# Patient Record
Sex: Female | Born: 1980 | Hispanic: No | Marital: Married | Smoking: Never smoker
Health system: Southern US, Community
[De-identification: ages and names within clinical notes are randomized; demographics above are authoritative.]

---

## 2014-06-04 NOTE — L&D Delivery Note (Signed)
Deliver Note   Date of Delivery:   05/19/2015 Primary OB:   [facility]  Gestational Age/EDD: 133w6d by 05/27/2015, by Other Basis  Antepartum complications:  OB History    Gravida Para Term Preterm AB TAB SAB Ectopic Multiple Living   1 1 1       0 0      Delivered By:   Vena AustriaStaebler, Carnie Bruemmer MD  Delivery Type:   Forceps Anesthesia:     Epidural  Intrapartum complications:  GBS:    Negative (12/05 1706) Laceration:    2nd degree Episiotomy:    none Placenta:    Spontaneous Estimated Blood Loss:  300mL Baby:     Liveborn female  APGAR (1 MIN): 8   APGAR (5 MINS): 9   Weight  6lbs 4oz  Deliver Details   With pushing fetus began displaying deceleration into the 90's with 1-2 minute recover, variable response to scalp stimulation.  After discussion with patient on pro's and con's of expediting delivery decision made to proceed with forceps.  Fetus was noted to be in ROA position approximately 15 degree off center, at +2 station pushing to +3.  Long Elliot forceps were applied, placement was check and lined up with midline suture.  With next contraction forceps were articulated.  At the end of the contraction forceps were disarticulated and re-articulated with next contraction.  The fetal occiput was guided under the pubic symphysis, as the head began to crown the forceps were removed.  The left hand was rapped across the fetal neck and was splinted and delivered, remainder of the body delivery with ease.  No nuchal or body cords noted with delivery.  Cord was cut by father once it stopped pulsing.  Placenta delivered shortly thereafter.  Inspection of the perineum revealed intact external sphincter capsule.  The bulbocavernosous muscles were re-approximated in the midline using a 2-0 Vicryl sutures and tied into the external anal sphincter muscle bringing the deep layers together and rebuilding the perineal body  The tear did not extend deep into the vaginal vault, however the skin did tear to almost  the level of anus.  Remaining skin defect was closed with 4-0 monocryl in a subcuticular fashion.  Rectal exam was performed noting good sphincter tone without palpable defects, no suture material in the rectum.   Mom to postpartum.  Baby to Couplet care / Skin to Skin.

## 2014-11-12 LAB — HM PAP SMEAR: HM Pap smear: NEGATIVE

## 2014-11-12 LAB — OB RESULTS CONSOLE GC/CHLAMYDIA
Chlamydia: NEGATIVE
Gonorrhea: NEGATIVE

## 2015-03-09 LAB — OB RESULTS CONSOLE HIV ANTIBODY (ROUTINE TESTING): HIV: NONREACTIVE

## 2015-03-09 LAB — OB RESULTS CONSOLE RPR: RPR: NONREACTIVE

## 2015-05-09 LAB — OB RESULTS CONSOLE GBS: GBS: NEGATIVE

## 2015-05-18 ENCOUNTER — Inpatient Hospital Stay
Admission: EM | Admit: 2015-05-18 | Discharge: 2015-05-20 | DRG: 775 | Disposition: A | Discharge status: Home / Self Care | Payer: 59 | Attending: Obstetrics and Gynecology | Admitting: Obstetrics and Gynecology

## 2015-05-18 DIAGNOSIS — Z3A38 38 weeks gestation of pregnancy: Secondary | ICD-10-CM

## 2015-05-18 DIAGNOSIS — O4292 Full-term premature rupture of membranes, unspecified as to length of time between rupture and onset of labor: Principal | ICD-10-CM | POA: Diagnosis present

## 2015-05-18 LAB — CBC
HCT: 37.2 % (ref 35.0–47.0)
HEMOGLOBIN: 12.2 g/dL (ref 12.0–16.0)
MCH: 28.3 pg (ref 26.0–34.0)
MCHC: 32.8 g/dL (ref 32.0–36.0)
MCV: 86.3 fL (ref 80.0–100.0)
PLATELETS: 241 10*3/uL (ref 150–440)
RBC: 4.31 MIL/uL (ref 3.80–5.20)
RDW: 13.3 % (ref 11.5–14.5)
WBC: 12.9 10*3/uL — AB (ref 3.6–11.0)

## 2015-05-18 LAB — TYPE AND SCREEN
ABO/RH(D): B POS
Antibody Screen: NEGATIVE

## 2015-05-18 MED ORDER — TERBUTALINE SULFATE 1 MG/ML IJ SOLN: 0.2500 mg | Freq: Once | INTRAMUSCULAR | Status: DC | PRN

## 2015-05-18 MED ORDER — OXYTOCIN 10 UNIT/ML IJ SOLN: 10.0000 [IU] | Freq: Once | INTRAMUSCULAR | Status: DC

## 2015-05-18 MED ORDER — LACTATED RINGERS IV SOLN
500.0000 mL | INTRAVENOUS | Status: DC | PRN
  Administered 2015-05-19 (×2): 1000 mL via INTRAVENOUS

## 2015-05-18 MED ORDER — OXYTOCIN 10 UNIT/ML IJ SOLN
INTRAMUSCULAR | Status: AC
  Filled 2015-05-18: qty 2

## 2015-05-18 MED ORDER — BUTORPHANOL TARTRATE 1 MG/ML IJ SOLN
1.0000 mg | INTRAMUSCULAR | Status: DC | PRN
  Administered 2015-05-18 – 2015-05-19 (×2): 2 mg via INTRAVENOUS
  Filled 2015-05-18 (×2): qty 2

## 2015-05-18 MED ORDER — ONDANSETRON HCL 4 MG/2ML IJ SOLN
4.0000 mg | Freq: Four times a day (QID) | INTRAMUSCULAR | Status: DC | PRN
  Administered 2015-05-19: 4 mg via INTRAVENOUS
  Filled 2015-05-18: qty 2

## 2015-05-18 MED ORDER — OXYTOCIN 40 UNITS IN LACTATED RINGERS INFUSION - SIMPLE MED
62.5000 mL/h | INTRAVENOUS | Status: DC
  Administered 2015-05-19: 62.5 mL/h via INTRAVENOUS
  Administered 2015-05-19: 999 mL/h via INTRAVENOUS
  Filled 2015-05-18: qty 1000

## 2015-05-18 MED ORDER — CITRIC ACID-SODIUM CITRATE 334-500 MG/5ML PO SOLN: 30.0000 mL | ORAL | Status: DC | PRN

## 2015-05-18 MED ORDER — LIDOCAINE HCL (PF) 1 % IJ SOLN
INTRAMUSCULAR | Status: AC
  Filled 2015-05-18: qty 30

## 2015-05-18 MED ORDER — AMMONIA AROMATIC IN INHA
RESPIRATORY_TRACT | Status: AC
  Filled 2015-05-18: qty 10

## 2015-05-18 MED ORDER — OXYTOCIN BOLUS FROM INFUSION: 500.0000 mL | INTRAVENOUS | Status: DC

## 2015-05-18 MED ORDER — LIDOCAINE HCL (PF) 1 % IJ SOLN
30.0000 mL | INTRAMUSCULAR | Status: AC | PRN
  Administered 2015-05-19: 3 mL via SUBCUTANEOUS

## 2015-05-18 MED ORDER — OXYTOCIN 40 UNITS IN LACTATED RINGERS INFUSION - SIMPLE MED
1.0000 m[IU]/min | INTRAVENOUS | Status: DC
  Administered 2015-05-18: 2 m[IU]/min via INTRAVENOUS

## 2015-05-18 MED ORDER — MISOPROSTOL 200 MCG PO TABS
ORAL_TABLET | ORAL | Status: AC
  Filled 2015-05-18: qty 4

## 2015-05-18 MED ORDER — LACTATED RINGERS IV SOLN
INTRAVENOUS | Status: DC
  Administered 2015-05-18: 21:00:00 via INTRAVENOUS
  Administered 2015-05-19: 125 mL/h via INTRAVENOUS

## 2015-05-18 NOTE — Progress Notes (Signed)
Subjective:  Doing well, increasing intensity of contractions  Objective:   Vitals: Blood pressure 117/70, pulse 84, temperature 97.6 F (36.4 C), temperature source Oral, resp. rate 16, height 5\' 3"  (1.6 m), weight 70.761 kg (156 lb). General: NAD Abdomen:gravid, non-tender Cervical Exam:  Dilation: 2.5 Effacement (%): 70 Cervical Position: Middle Station: -2 Presentation: Vertex Exam by:: Bonney AidStaebler, MD  FHT: 140, moderate, +accels, no decels Toco: q525min  Results for orders placed or performed during the hospital encounter of 05/18/15 (from the past 24 hour(s))  CBC     Status: Abnormal   Collection Time: 05/18/15  8:51 PM  Result Value Ref Range   WBC 12.9 (H) 3.6 - 11.0 K/uL   RBC 4.31 3.80 - 5.20 MIL/uL   Hemoglobin 12.2 12.0 - 16.0 g/dL   HCT 69.637.2 29.535.0 - 28.447.0 %   MCV 86.3 80.0 - 100.0 fL   MCH 28.3 26.0 - 34.0 pg   MCHC 32.8 32.0 - 36.0 g/dL   RDW 13.213.3 44.011.5 - 10.214.5 %   Platelets 241 150 - 440 K/uL  Type and screen     Status: None   Collection Time: 05/18/15  8:51 PM  Result Value Ref Range   ABO/RH(D) B POS    Antibody Screen NEG    Sample Expiration 05/21/2015     Assessment:   34 y.o. G1P0 9860w5d with PROM  Plan:   1) Labor - start pitocin  2) Fetus - cat I tracing

## 2015-05-18 NOTE — H&P (Signed)
Obstetric H&P   Chief Complaint: Leaking fluid  Prenatal Care Provider: [facility]  History of Present Illness: 34 y.o. G1P0 at 38 weeks 5 days based on 6 day embryo transfer date of 09/09/14 giving EDC of 05/27/2015.  The patient noticed some increased discharge yesterday, was seen in clinic with negative nitrazine, ferning, and pooling.  She had some spotting overnight and contractions with mild spotting.  Contractions abated and she was seen again in clinic this afternoon with minimal change in cervix from 1.5/70/-2 to 2.5/70/-2 with no bleeding noted on exam and physiologic appearing discharge.  Went shopping after exam and soaked through her underwear.  She had been provided with a nitrazine strip for home testing and this was grossly positive at which time she was instructed to present to L&D with gross ROM noted.  +FM, mild contractions, no VB at present.  PNC noteable for ART pregnancy, negative PGD and fetal echo.  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  She did have some decease in fundal height coinciding with a drop in station noted yesterday and was scheduled for a growth ultrasound on Friday.   Weight gain this pregnancy appropriate at 23lbs,  ABO, Rh: B pos ABSC: negative   Rubella: Immune HCV: negative RPR: Non-reactive HBsAg: negative HIV: negative Hgb electrophoresis: AAA Pap 11/12/14 negative and negative HPV CMV: IgG positive 1-hr: 134 GBS: negative 05/09/2015  TDAP and influenza up to date  Review of Systems: 10 point review of systems negative unless otherwise noted in HPI  Past Medical History: No past medical history on file.  Past Surgical History: History reviewed. No pertinent past surgical history.   Family History: History reviewed. No pertinent family history.  Social History: Social History   Social History  . Marital Status: Married    Spouse Name: N/A  . Number of Children: N/A  . Years of Education: N/A   Occupational History  . Not on  file.   Social History Main Topics  . Smoking status: Never Smoker   . Smokeless tobacco: Never Used  . Alcohol Use: No  . Drug Use: No  . Sexual Activity: Yes    Birth Control/ Protection: None   Other Topics Concern  . Not on file   Social History Narrative  . No narrative on file    Medications: Prior to Admission medications   Medication Sig Start Date End Date Taking? Authorizing Provider  ferrous fumarate (HEMOCYTE - 106 MG FE) 325 (106 FE) MG TABS tablet Take 1 tablet by mouth.   Yes Historical Provider, MD  Prenatal Vit-Fe Fumarate-FA (PRENATAL MULTIVITAMIN) TABS tablet Take 1 tablet by mouth daily at 12 noon.   Yes Historical Provider, MD    Allergies: No Known Allergies  Physical Exam: Vitals: Blood pressure 117/70, pulse 84, temperature 97.6 F (36.4 C), temperature source Oral, resp. rate 16, height 5\' 3"  (1.6 m), weight 70.761 kg (156 lb).  Urine Dip Protein: N/A  FHT: 130, moderate, +accels, no decels Toco: q4-475min  General: NAD HEENT: normocephalic, anicteric Pulmonary: no increased work of breathing Abdomen: Gravid, non-tender Leopolds: vtx Genitourinary: grossly ruptured 2.5/70/-2 in clinic 16:30 today Extremities: no edema  Labs: No results found for this or any previous visit (from the past 24 hour(s)).  Assessment: 34 y.o. G1P0 at 3169w5d presenting with PROM  Plan: 1) PROM - is contracting fairly regular, will monitor contraction pattern recheck in 2-hrs to see if change from clinic check.  If no change start pitocin  2) Fetus -  category I tracing, negative spontaneous contraction stress test on presentation  3) A pos / ABSC neg / RI / HBsAg neg / HIV neg / RPR NR / CMV IgG positive (work in pediatrics) / HCV negative / Hgb AA / 1-hr 134 / GBS negative 05/09/15 / and normal pap   4) TDAP (04/19/2015) and influenza (03/01/2015) up to date   5) Disposition - pending delivery anticipate vaginal

## 2015-05-19 ENCOUNTER — Inpatient Hospital Stay: Payer: 59 | Admitting: Anesthesiology

## 2015-05-19 ENCOUNTER — Encounter: Payer: Self-pay | Admitting: *Deleted

## 2015-05-19 LAB — ABO/RH: ABO/RH(D): B POS

## 2015-05-19 MED ORDER — SENNOSIDES-DOCUSATE SODIUM 8.6-50 MG PO TABS: 2.0000 | ORAL_TABLET | ORAL | Status: DC

## 2015-05-19 MED ORDER — FENTANYL 2.5 MCG/ML W/ROPIVACAINE 0.2% IN NS 100 ML EPIDURAL INFUSION (ARMC-ANES): 9.0000 mL/h | EPIDURAL | Status: DC

## 2015-05-19 MED ORDER — PRENATAL MULTIVITAMIN CH
1.0000 | ORAL_TABLET | Freq: Every day | ORAL | Status: DC
  Administered 2015-05-20: 1 via ORAL
  Filled 2015-05-19: qty 1

## 2015-05-19 MED ORDER — SIMETHICONE 80 MG PO CHEW: 80.0000 mg | CHEWABLE_TABLET | ORAL | Status: DC | PRN

## 2015-05-19 MED ORDER — IBUPROFEN 600 MG PO TABS: 600.0000 mg | ORAL_TABLET | Freq: Four times a day (QID) | ORAL | Status: DC

## 2015-05-19 MED ORDER — DIPHENHYDRAMINE HCL 25 MG PO CAPS: 25.0000 mg | ORAL_CAPSULE | Freq: Four times a day (QID) | ORAL | Status: DC | PRN

## 2015-05-19 MED ORDER — LANOLIN HYDROUS EX OINT: TOPICAL_OINTMENT | CUTANEOUS | Status: DC | PRN

## 2015-05-19 MED ORDER — BENZOCAINE-MENTHOL 20-0.5 % EX AERO
INHALATION_SPRAY | CUTANEOUS | Status: AC
  Filled 2015-05-19: qty 56

## 2015-05-19 MED ORDER — DIPHENHYDRAMINE HCL 50 MG/ML IJ SOLN: 12.5000 mg | INTRAMUSCULAR | Status: DC | PRN

## 2015-05-19 MED ORDER — FENTANYL 2.5 MCG/ML W/ROPIVACAINE 0.2% IN NS 100 ML EPIDURAL INFUSION (ARMC-ANES)
EPIDURAL | Status: AC
  Administered 2015-05-19: 9 mL/h via EPIDURAL
  Filled 2015-05-19: qty 100

## 2015-05-19 MED ORDER — IBUPROFEN 600 MG PO TABS
600.0000 mg | ORAL_TABLET | Freq: Four times a day (QID) | ORAL | Status: DC
  Administered 2015-05-19 – 2015-05-20 (×3): 600 mg via ORAL
  Filled 2015-05-19 (×4): qty 1

## 2015-05-19 MED ORDER — LIDOCAINE-EPINEPHRINE (PF) 1.5 %-1:200000 IJ SOLN
INTRAMUSCULAR | Status: DC | PRN
  Administered 2015-05-19: 4 mL via EPIDURAL

## 2015-05-19 MED ORDER — PHENYLEPHRINE 40 MCG/ML (10ML) SYRINGE FOR IV PUSH (FOR BLOOD PRESSURE SUPPORT)
80.0000 ug | PREFILLED_SYRINGE | INTRAVENOUS | Status: DC | PRN
  Filled 2015-05-19: qty 2

## 2015-05-19 MED ORDER — EPHEDRINE 5 MG/ML INJ
10.0000 mg | INTRAVENOUS | Status: DC | PRN
  Filled 2015-05-19: qty 2

## 2015-05-19 MED ORDER — BUPIVACAINE HCL (PF) 0.25 % IJ SOLN
INTRAMUSCULAR | Status: DC | PRN
  Administered 2015-05-19: 5 mL via EPIDURAL

## 2015-05-19 MED ORDER — DIBUCAINE 1 % RE OINT: 1.0000 "application " | TOPICAL_OINTMENT | RECTAL | Status: DC | PRN

## 2015-05-19 MED ORDER — ONDANSETRON HCL 4 MG/2ML IJ SOLN: 4.0000 mg | INTRAMUSCULAR | Status: DC | PRN

## 2015-05-19 MED ORDER — IBUPROFEN 600 MG PO TABS
ORAL_TABLET | ORAL | Status: AC
  Administered 2015-05-19: 600 mg
  Filled 2015-05-19: qty 1

## 2015-05-19 MED ORDER — ONDANSETRON HCL 4 MG PO TABS: 4.0000 mg | ORAL_TABLET | ORAL | Status: DC | PRN

## 2015-05-19 MED ORDER — BENZOCAINE-MENTHOL 20-0.5 % EX AERO: 1.0000 "application " | INHALATION_SPRAY | CUTANEOUS | Status: DC | PRN

## 2015-05-19 MED ORDER — HYDROCODONE-ACETAMINOPHEN 5-325 MG PO TABS: 1.0000 | ORAL_TABLET | ORAL | Status: DC | PRN

## 2015-05-19 MED ORDER — WITCH HAZEL-GLYCERIN EX PADS: 1.0000 "application " | MEDICATED_PAD | CUTANEOUS | Status: DC | PRN

## 2015-05-19 NOTE — Lactation Note (Signed)
This note was copied from the chart of Boy Leanne ChangZainab Kempfer. Lactation Consultation Note  Patient Name: Boy Leanne ChangZainab Mickiewicz [id number]'s Date: 05/19/2015     Maternal Data    Feeding Feeding Type: Breast Fed                             Trudee GripCarolyn P Harman Langhans 05/19/2015, 8:11 PM

## 2015-05-19 NOTE — Lactation Note (Signed)
This note was copied from the chart of Boy Leanne ChangZainab Himes. Lactation Consultation Note  Patient Name: Boy Leanne ChangZainab Whiters [id number]'s Date: 05/19/2015     Maternal Data    Feeding Feeding Type: Breast Fed  LATCH Score/Interventions                      Lactation Tools Discussed/Used     Consult Status      Trudee GripCarolyn P Jsiah Menta 05/19/2015, 8:22 PM

## 2015-05-19 NOTE — Discharge Summary (Addendum)
Obstetric Discharge Summary Reason for Admission: rupture of membranes Prenatal Procedures: NST Intrapartum Procedures: spontaneous vaginal delivery and forceps (low) Postpartum Procedures: none Complications-Operative and Postpartum: none HEMOGLOBIN  Date Value Ref Range Status  05/18/2015 12.2 12.0 - 16.0 g/dL Final   HCT  Date Value Ref Range Status  05/18/2015 37.2 35.0 - 47.0 % Final    Physical Exam:  General: alert, appears stated age and no distress Lochia: appropriate Uterine Fundus: firm DVT Evaluation: No evidence of DVT seen on physical exam.  Discharge Diagnoses: Term Pregnancy-delivered  Discharge Information: Date: 05/20/2015 Activity: pelvic rest Diet: routine   Medication List    TAKE these medications        ferrous fumarate 325 (106 FE) MG Tabs tablet  Commonly known as:  HEMOCYTE - 106 mg FE  Take 1 tablet by mouth.     HYDROcodone-acetaminophen 5-325 MG tablet  Commonly known as:  NORCO/VICODIN  Take 1-2 tablets by mouth every 4 (four) hours as needed for moderate pain or severe pain.     ibuprofen 600 MG tablet  Commonly known as:  ADVIL,MOTRIN  Take 1 tablet (600 mg total) by mouth every 6 (six) hours.     prenatal multivitamin Tabs tablet  Take 1 tablet by mouth daily at 12 noon.        Condition: stable Discharge to: home Follow-up Information    Follow up with Lorrene ReidSTAEBLER, Jariah Jarmon M, MD In 6 weeks.   Specialty:  Obstetrics and Gynecology   Why:  postpartum visit   Contact information:   [address] [place] [address] [phone]       Newborn Data: Live born female  Birth Weight: 6 lb 3 oz (2807 g) APGAR: 8, 9  Home with mother.  Brook Geraci M 05/20/2015, 8:11 AM

## 2015-05-19 NOTE — Anesthesia Procedure Notes (Signed)
Epidural Patient location during procedure: OB  Staffing Performed by: anesthesiologist   Preanesthetic Checklist Completed: patient identified, site marked, surgical consent, pre-op evaluation, timeout performed, IV checked, risks and benefits discussed and monitors and equipment checked  Epidural Patient position: sitting Prep: Betadine Patient monitoring: heart rate, continuous pulse ox and blood pressure Approach: midline Location: L4-L5 Injection technique: LOR air  Needle:  Needle type: Tuohy  Needle gauge: 18 G Needle length: 9 cm and 9 Needle insertion depth: 5 cm Catheter type: closed end flexible Catheter size: 20 Guage Catheter at skin depth: 10 cm Test dose: negative and 1.5% lidocaine with Epi 1:200 K  Assessment Sensory level: T10 Events: blood not aspirated, injection not painful, no injection resistance, negative IV test and no paresthesia  Additional Notes Pt's history reviewed and consent obtained as per OB consent Patient tolerated the insertion well without complications. Negative SATD, negative IVTD All VSS were obtained and monitored through OBIX and nursing protocols followed.Reason for block:procedure for pain

## 2015-05-19 NOTE — Lactation Note (Signed)
This note was copied from the chart of Carla Leanne ChangZainab Lanese. Lactation Consultation Note  Patient Name: Carla Holt [id number]'s Date: 05/19/2015     Maternal Data    Feeding Feeding Type: Breast Fed  LATCH Score/Interventions No interventions at this time                      Lactation Tools Discussed/Used     Consult Status      Trudee GripCarolyn P Jeanpaul Biehl 05/19/2015, 8:10 PM

## 2015-05-19 NOTE — Anesthesia Preprocedure Evaluation (Signed)
Anesthesia Evaluation  Patient identified by MRN, date of birth, ID band Patient awake    Reviewed: Allergy & Precautions, H&P , NPO status , Patient's Chart, lab work & pertinent test results, reviewed documented beta blocker date and time   Airway Mallampati: III  TM Distance: >3 FB Neck ROM: full    Dental no notable dental hx. (+) Teeth Intact   Pulmonary neg pulmonary ROS,    Pulmonary exam normal breath sounds clear to auscultation       Cardiovascular Exercise Tolerance: Good negative cardio ROS Normal cardiovascular exam Rhythm:regular Rate:Normal     Neuro/Psych negative neurological ROS  negative psych ROS   GI/Hepatic negative GI ROS, Neg liver ROS,   Endo/Other  negative endocrine ROS  Renal/GU negative Renal ROS  negative genitourinary   Musculoskeletal   Abdominal   Peds  Hematology negative hematology ROS (+)   Anesthesia Other Findings   Reproductive/Obstetrics (+) Pregnancy                             Anesthesia Physical Anesthesia Plan  ASA: II  Anesthesia Plan: Regional and Epidural   Post-op Pain Management:    Induction:   Airway Management Planned:   Additional Equipment:   Intra-op Plan:   Post-operative Plan:   Informed Consent: I have reviewed the patients History and Physical, chart, labs and discussed the procedure including the risks, benefits and alternatives for the proposed anesthesia with the patient or authorized representative who has indicated his/her understanding and acceptance.     Plan Discussed with: CRNA  Anesthesia Plan Comments:         Anesthesia Quick Evaluation  

## 2015-05-20 LAB — CBC
HEMATOCRIT: 24.1 % — AB (ref 35.0–47.0)
Hemoglobin: 7.9 g/dL — ABNORMAL LOW (ref 12.0–16.0)
MCH: 28.6 pg (ref 26.0–34.0)
MCHC: 32.9 g/dL (ref 32.0–36.0)
MCV: 86.9 fL (ref 80.0–100.0)
PLATELETS: 185 10*3/uL (ref 150–440)
RBC: 2.77 MIL/uL — ABNORMAL LOW (ref 3.80–5.20)
RDW: 13.2 % (ref 11.5–14.5)
WBC: 12.1 10*3/uL — ABNORMAL HIGH (ref 3.6–11.0)

## 2015-05-20 LAB — RPR: RPR: NONREACTIVE

## 2015-05-20 MED ORDER — IBUPROFEN 600 MG PO TABS: 600.0000 mg | ORAL_TABLET | Freq: Four times a day (QID) | ORAL | Status: DC

## 2015-05-20 MED ORDER — HYDROCODONE-ACETAMINOPHEN 5-325 MG PO TABS: 1.0000 | ORAL_TABLET | ORAL | Status: DC | PRN

## 2015-05-20 NOTE — Progress Notes (Signed)
All discharge instructions given to patient and she voiced understanding of all instructions given. She will make her own f/u appt. Prescription given. Waiting on birth certificate and alarm removal.

## 2015-05-20 NOTE — Discharge Instructions (Signed)
Follow up sooner with fever, problems breathing, pain not helped by medications, severe depression( more than just baby blues, wanting to hurt yourself or the baby), severe bleeding ( saturating more than one pad an hour or large palm sized clots), no heavy lifting , no driving while taking narcotics, no douches, intercourse, tampons or enemas for 6 weeks  °

## 2015-05-20 NOTE — Anesthesia Postprocedure Evaluation (Signed)
Anesthesia Post Note  Patient: Carla Holt  Procedure(s) Performed: * No procedures listed *  Patient location during evaluation: Mother Baby Anesthesia Type: Epidural Level of consciousness: awake, awake and alert and oriented Pain management: pain level controlled Vital Signs Assessment: post-procedure vital signs reviewed and stable Respiratory status: spontaneous breathing and respiratory function stable Cardiovascular status: blood pressure returned to baseline and stable Postop Assessment: no headache, no signs of nausea or vomiting, adequate PO intake and patient able to bend at knees    Last Vitals:  Filed Vitals:   05/19/15 2311 05/20/15 0304  BP: 100/58 100/59  Pulse: 101 99  Temp: 36.4 C 36.9 C  Resp: 20     Last Pain:  Filed Vitals:   05/20/15 0305  PainSc: 4                  Michaele OfferSavage,  Leva Baine A

## 2015-05-20 NOTE — Addendum Note (Signed)
Addendum  created 05/20/15 [id number] by Yves DillPaul Verba Ainley, MD   Modules edited: Anesthesia Attestations, Anesthesia Responsible Staff

## 2015-10-10 DIAGNOSIS — N1330 Unspecified hydronephrosis: Secondary | ICD-10-CM | POA: Diagnosis not present

## 2015-10-10 DIAGNOSIS — N23 Unspecified renal colic: Secondary | ICD-10-CM | POA: Diagnosis not present

## 2015-10-10 DIAGNOSIS — N200 Calculus of kidney: Secondary | ICD-10-CM | POA: Diagnosis not present

## 2015-10-10 DIAGNOSIS — N132 Hydronephrosis with renal and ureteral calculous obstruction: Secondary | ICD-10-CM | POA: Diagnosis not present

## 2015-10-10 DIAGNOSIS — R109 Unspecified abdominal pain: Secondary | ICD-10-CM | POA: Diagnosis not present

## 2015-12-15 DIAGNOSIS — N979 Female infertility, unspecified: Secondary | ICD-10-CM | POA: Diagnosis not present

## 2015-12-17 DIAGNOSIS — N200 Calculus of kidney: Secondary | ICD-10-CM | POA: Diagnosis not present

## 2015-12-17 DIAGNOSIS — R1012 Left upper quadrant pain: Secondary | ICD-10-CM | POA: Diagnosis not present

## 2015-12-18 DIAGNOSIS — R1012 Left upper quadrant pain: Secondary | ICD-10-CM | POA: Diagnosis not present

## 2015-12-18 DIAGNOSIS — N200 Calculus of kidney: Secondary | ICD-10-CM | POA: Diagnosis not present

## 2015-12-26 ENCOUNTER — Encounter: Payer: Self-pay | Admitting: Urology

## 2015-12-26 ENCOUNTER — Ambulatory Visit (INDEPENDENT_AMBULATORY_CARE_PROVIDER_SITE_OTHER): Payer: 59 | Admitting: Urology

## 2015-12-26 DIAGNOSIS — N200 Calculus of kidney: Secondary | ICD-10-CM

## 2015-12-26 NOTE — Progress Notes (Signed)
12/26/2015 10:03 AM   Carla Holt [birth year] [id number]  Referring provider: No referring provider defined for this encounter.  No chief complaint on file.   HPI:  1 - Recurrent Nephrolithiasis -  10/2015 - medical passage of right ureteral stone (confirmed on CT in [place]) 12/2015 - 4mm left distal ureteral stone with mild hydro by CT done at [facility] in [facility] 12/2015. [name] is solitary per radiology report but actual images not able to review. UA,UCX without infetious parameters. Given trial of medical therapy and reports interval resolution of pain, stopped straining urine.  2 - Metabolic Stone Disease -  Eval 2017: BMP,PTH,Urate - pending; Composition - CaOx crystals on UA (no true stone analysis); 24 Hr Urines - pending  PMH sig for infertility (success after IVF with [facility] in [place] area). No surgeries. No regular medications. 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   Today " Carla Holt " (pronounced zane-ab) is seen as new patient for above.    PMH: No past medical history on file.  Surgical History: No past surgical history on file.  Home Medications:    Medication List       Accurate as of 12/26/15 10:03 AM. Always use your most recent med list.          ferrous fumarate 325 (106 Fe) MG Tabs tablet Commonly known as:  HEMOCYTE - 106 mg FE Take 1 tablet by mouth.   HYDROcodone-acetaminophen 5-325 MG tablet Commonly known as:  NORCO/VICODIN Take 1-2 tablets by mouth every 4 (four) hours as needed for moderate pain or severe pain.   ibuprofen 600 MG tablet Commonly known as:  ADVIL,MOTRIN Take 1 tablet (600 mg total) by mouth every 6 (six) hours.   prenatal multivitamin Tabs tablet Take 1 tablet by mouth daily at 12 noon.       Allergies: No Known Allergies  Family History: No family history on file.  Social History:  reports that she has never smoked. She has never used smokeless tobacco. She reports that she does not drink  alcohol or use drugs.   Review of Systems  Gastrointestinal (upper)  : Negative for upper GI symptoms  Gastrointestinal (lower) : Negative for lower GI symptoms  Constitutional : Negative for symptoms  Skin: Negative for skin symptoms  Eyes: Negative for eye symptoms  Ear/Nose/Throat : Negative for Ear/Nose/Throat symptoms  Hematologic/Lymphatic: Negative for Hematologic/Lymphatic symptoms  Cardiovascular : Negative for cardiovascular symptoms  Respiratory : Negative for respiratory symptoms  Endocrine: Negative for endocrine symptoms  Musculoskeletal: Negative for musculoskeletal symptoms  Neurological: Negative for neurological symptoms  Psychologic: Negative for psychiatric symptoms    Physical Exam: There were no vitals taken for this visit.  Constitutional:  Alert and oriented, No acute distress. HEENT: [place] AT, moist mucus membranes.  Trachea midline, no masses. Cardiovascular: No clubbing, cyanosis, or edema. Respiratory: Normal respiratory effort, no increased work of breathing. GI: Abdomen is soft, nontender, nondistended, no abdominal masses GU: No CVA tenderness.  Skin: No rashes, bruises or suspicious lesions. Lymph: No cervical adenopathy. Neurologic: Grossly intact, no focal deficits, moving all 4 extremities. Psychiatric: Normal mood and affect.   Urinalysis No results found for: COLORURINE, APPEARANCEUR, LABSPEC, PHURINE, GLUCOSEU, HGBUR, BILIRUBINUR, KETONESUR, PROTEINUR, UROBILINOGEN, NITRITE, LEUKOCYTESUR  Pertinent Imaging: As per HPI  Assessment & Plan:    1. Recurrent Nephrolithiasis - now likely stone free as symptoms resolved and most recent stone 12mm distal. Did frankly discuss that distal stone may be intermittently obstructing and in  situ as she does still have some microhematuria today.   Also frankly discussed implicaiotns of nephrolithiasis in fertile age female with STRONG reccomendation of renal imaging prior to future  elective pregnancies, and yearly surveillance with KUB / RUS and likely CT stone prior to additional elective IVF cycles to maximally verify stone free prior.   2 - Metabolic Stone Disease - I feel eval warranted as multifocal / recurrent at young age and desiring additional pregnancies. BMP,PTH with iCa, Urate today. 24 hr urines on return. She will bring any stone fragments that she passes in future.   RTC about 2 mos with 24 hr urines prior.   Sebastian Ache, MD  [facility] [address] [place], [place] [address] [phone]

## 2015-12-27 LAB — URINALYSIS, COMPLETE
Bilirubin, UA: NEGATIVE
Glucose, UA: NEGATIVE
Ketones, UA: NEGATIVE
Leukocytes, UA: NEGATIVE
Nitrite, UA: NEGATIVE
PH UA: 6 (ref 5.0–7.5)
PROTEIN UA: NEGATIVE
Specific Gravity, UA: 1.025 (ref 1.005–1.030)
UUROB: 0.2 mg/dL (ref 0.2–1.0)

## 2015-12-27 LAB — PTH, INTACT AND CALCIUM: PTH: 37 pg/mL (ref 15–65)

## 2015-12-27 LAB — BASIC METABOLIC PANEL
BUN / CREAT RATIO: 24 — AB (ref 9–23)
BUN: 17 mg/dL (ref 6–20)
CALCIUM: 9.6 mg/dL (ref 8.7–10.2)
CHLORIDE: 100 mmol/L (ref 96–106)
CO2: 19 mmol/L (ref 18–29)
CREATININE: 0.7 mg/dL (ref 0.57–1.00)
GFR calc Af Amer: 133 mL/min/{1.73_m2} (ref >59)
GFR calc non Af Amer: 115 mL/min/{1.73_m2} (ref >59)
GLUCOSE: 121 mg/dL — AB (ref 65–99)
Potassium: 3.7 mmol/L (ref 3.5–5.2)
Sodium: 139 mmol/L (ref 134–144)

## 2015-12-27 LAB — MICROSCOPIC EXAMINATION: WBC UA: NONE SEEN /HPF (ref 0–?)

## 2015-12-27 LAB — URIC ACID: URIC ACID: 3.6 mg/dL (ref 2.5–7.1)

## 2016-01-13 ENCOUNTER — Telehealth: Payer: Self-pay | Admitting: Urology

## 2016-01-13 NOTE — Telephone Encounter (Signed)
Pt called and said she still hasn't received her 24 hour urine.  Please give her a call.

## 2016-01-16 ENCOUNTER — Encounter: Payer: Self-pay | Admitting: Urology

## 2016-01-16 NOTE — Telephone Encounter (Signed)
Spoke with pt in reference to [facility]. New 24 hour orders faxed.

## 2016-01-16 NOTE — Telephone Encounter (Signed)
LMOM

## 2016-01-24 DIAGNOSIS — N200 Calculus of kidney: Secondary | ICD-10-CM | POA: Diagnosis not present

## 2016-01-25 DIAGNOSIS — N200 Calculus of kidney: Secondary | ICD-10-CM | POA: Diagnosis not present

## 2016-02-14 ENCOUNTER — Other Ambulatory Visit: Payer: Self-pay | Admitting: Urology

## 2016-02-20 ENCOUNTER — Ambulatory Visit (INDEPENDENT_AMBULATORY_CARE_PROVIDER_SITE_OTHER): Payer: 59 | Admitting: Urology

## 2016-02-20 ENCOUNTER — Encounter: Payer: Self-pay | Admitting: Urology

## 2016-02-20 VITALS — BP 113/77 | HR 92 | Ht 63.0 in | Wt 136.0 lb

## 2016-02-20 DIAGNOSIS — N200 Calculus of kidney: Secondary | ICD-10-CM | POA: Diagnosis not present

## 2016-02-20 DIAGNOSIS — R34 Anuria and oliguria: Secondary | ICD-10-CM | POA: Insufficient documentation

## 2016-02-20 NOTE — Progress Notes (Signed)
12/26/2015 10:03 AM   Carla Holt 1980-06-11 [id number]  Referring provider: No referring provider defined for this encounter.  No chief complaint on file.   HPI:  1 - Recurrent Nephrolithiasis -  10/2015 - medical passage of right ureteral stone (confirmed on CT in [place]) 12/2015 - 2mm left distal ureteral stone with mild hydro by CT done at [facility] in [facility] 12/2015. Carla Holt is solitary per radiology report but actual images not able to review. UA,UCX without infetious parameters. Given trial of medical therapy and reports interval resolution of pain, stopped straining urine.  2 - Metabolic Stone Disease /  Oliguria -  Eval 2017: BMP,PTH,Urate - normal; Composition - CaOx crystals on UA (no true stone analysis); 24 Hr Urines - extremely low volume ( or less).  PMH sig for infertility (success after IVF with [facility] in [place] area). No surgeries. No regular medications. 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   Today " Carla Holt " is seen in f/u above. Metabolic eval with impressive oliguria, otherwise unremarkable. She is looking to have IVF again in December.    PMH: No past medical history on file.  Surgical History: No past surgical history on file.  Home Medications:    Medication List       Accurate as of 12/26/15 10:03 AM. Always use your most recent med list.          ferrous fumarate 325 (106 Fe) MG Tabs tablet Commonly known as:  HEMOCYTE - 106 mg FE Take 1 tablet by mouth.   HYDROcodone-acetaminophen 5-325 MG tablet Commonly known as:  NORCO/VICODIN Take 1-2 tablets by mouth every 4 (four) hours as needed for moderate pain or severe pain.   ibuprofen 600 MG tablet Commonly known as:  ADVIL,MOTRIN Take 1 tablet (600 mg total) by mouth every 6 (six) hours.   prenatal multivitamin Tabs tablet Take 1 tablet by mouth daily at 12 noon.       Allergies: No Known Allergies  Family History: No family history on file.  Social  History:  reports that she has never smoked. She has never used smokeless tobacco. She reports that she does not drink alcohol or use drugs.   Review of Systems  Gastrointestinal (upper)  : Negative for upper GI symptoms  Gastrointestinal (lower) : Negative for lower GI symptoms  Constitutional : Negative for symptoms  Skin: Negative for skin symptoms  Eyes: Negative for eye symptoms  Ear/Nose/Throat : Negative for Ear/Nose/Throat symptoms  Hematologic/Lymphatic: Negative for Hematologic/Lymphatic symptoms  Cardiovascular : Negative for cardiovascular symptoms  Respiratory : Negative for respiratory symptoms  Endocrine: Negative for endocrine symptoms  Musculoskeletal: Negative for musculoskeletal symptoms  Neurological: Negative for neurological symptoms  Psychologic: Negative for psychiatric symptoms    Physical Exam: There were no vitals taken for this visit.  Constitutional:  Alert and oriented, No acute distress. HEENT: [place] AT, moist mucus membranes.  Trachea midline, no masses. Cardiovascular: No clubbing, cyanosis, or edema. Respiratory: Normal respiratory effort, no increased work of breathing. GI: Abdomen is soft, nontender, nondistended, no abdominal masses GU: No CVA tenderness.  Skin: No rashes, bruises or suspicious lesions. Lymph: No cervical adenopathy. Neurologic: Grossly intact, no focal deficits, moving all 4 extremities. Psychiatric: Normal mood and affect.   Urinalysis No results found for: COLORURINE, APPEARANCEUR, LABSPEC, PHURINE, GLUCOSEU, HGBUR, BILIRUBINUR, KETONESUR, PROTEINUR, UROBILINOGEN, NITRITE, LEUKOCYTESUR  Pertinent Imaging: As per HPI  Assessment & Plan:    1. Recurrent Nephrolithiasis - now likely stone free as  symptoms resolved and most recent stone 2mm distal.   Also frankly discussed implicaiotns of nephrolithiasis in fertile age female with STRONG reccomendation of renal imaging prior to future elective  pregnancies, and yearly surveillance with KUB / RUS and likely CT stone prior to additional elective IVF cycles to maximally verify stone free prior.   She wants to have IVF again in December so I feel we should proceed with CT stone now to verify stone free. If stones present, then "pre-emptive" ureteroscpy to stone free would be warranted.   2 - Metabolic Stone Disease /  Oliguria -  Rec increase fluid, especially tart-flavored / high-citrate with goal of DOUBLING - TRIPLING total intake. Eval otherwise unremarkable which is good news.   3 - RTC 1 year with KUB, RUS or for stone surgery if imaging this week shows current burden.   Sebastian AcheMANNY, Hester Forget, MD  [facility] [address], [address] [place], [place] [address] [phone]

## 2016-02-28 ENCOUNTER — Ambulatory Visit: Payer: 59

## 2016-03-20 ENCOUNTER — Other Ambulatory Visit: Payer: Self-pay | Admitting: Urology

## 2016-03-20 ENCOUNTER — Other Ambulatory Visit: Payer: Self-pay

## 2016-03-20 DIAGNOSIS — R34 Anuria and oliguria: Secondary | ICD-10-CM

## 2016-03-20 DIAGNOSIS — N200 Calculus of kidney: Secondary | ICD-10-CM

## 2016-03-23 ENCOUNTER — Inpatient Hospital Stay: Admission: RE | Admit: 2016-03-23 | Payer: 59 | Source: Ambulatory Visit

## 2016-04-09 ENCOUNTER — Ambulatory Visit
Admission: RE | Admit: 2016-04-09 | Discharge: 2016-04-09 | Disposition: A | Discharge status: Home / Self Care | Payer: 59 | Source: Ambulatory Visit | Attending: Urology | Admitting: Urology

## 2016-04-09 DIAGNOSIS — Z87442 Personal history of urinary calculi: Secondary | ICD-10-CM | POA: Diagnosis not present

## 2016-04-09 DIAGNOSIS — N200 Calculus of kidney: Secondary | ICD-10-CM | POA: Diagnosis not present

## 2016-04-17 DIAGNOSIS — N979 Female infertility, unspecified: Secondary | ICD-10-CM | POA: Diagnosis not present

## 2016-04-24 ENCOUNTER — Telehealth: Payer: Self-pay | Admitting: Urology

## 2016-04-24 NOTE — Telephone Encounter (Signed)
Spoke with pt in reference to CT results. Pt voiced understanding.  

## 2016-04-24 NOTE — Telephone Encounter (Signed)
Patient is calling and requesting her CT results. She was told that she would be called with them and is wanting a return call.   Thanks,  michelle

## 2016-05-01 DIAGNOSIS — N979 Female infertility, unspecified: Secondary | ICD-10-CM | POA: Diagnosis not present

## 2016-05-11 DIAGNOSIS — N979 Female infertility, unspecified: Secondary | ICD-10-CM | POA: Diagnosis not present

## 2016-05-18 DIAGNOSIS — N979 Female infertility, unspecified: Secondary | ICD-10-CM | POA: Diagnosis not present

## 2016-05-25 DIAGNOSIS — N979 Female infertility, unspecified: Secondary | ICD-10-CM | POA: Diagnosis not present

## 2016-06-04 NOTE — L&D Delivery Note (Signed)
Delivery Note At 2:44 AM a viable female was delivered via Vaginal, Spontaneous Delivery (Presentation: ;  ).  APGAR: 8, 9; weight 5158 lb 12.2 oz (2340000 g).   Placenta status: spontaneous, intact.  Cord: 3VC with two true knots without complications: .  Cord pH: N/A  Anesthesia:  Epidural Episiotomy:  none Lacerations:  2nd degree Suture Repair: 2-0 Vicryl and 3-0 Monocryl Est. Blood Loss (mL): 300mL  Mom to postpartum.  Baby to Couplet care / Skin to Skin.  Carla Holt 02/01/2017, 3:16 AM

## 2016-06-05 DIAGNOSIS — N979 Female infertility, unspecified: Secondary | ICD-10-CM | POA: Diagnosis not present

## 2016-06-05 DIAGNOSIS — Z3201 Encounter for pregnancy test, result positive: Secondary | ICD-10-CM | POA: Diagnosis not present

## 2016-06-08 DIAGNOSIS — Z3201 Encounter for pregnancy test, result positive: Secondary | ICD-10-CM | POA: Diagnosis not present

## 2016-06-08 DIAGNOSIS — N979 Female infertility, unspecified: Secondary | ICD-10-CM | POA: Diagnosis not present

## 2016-06-27 DIAGNOSIS — O200 Threatened abortion: Secondary | ICD-10-CM | POA: Diagnosis not present

## 2016-07-26 DIAGNOSIS — Z3A11 11 weeks gestation of pregnancy: Secondary | ICD-10-CM | POA: Diagnosis not present

## 2016-07-26 DIAGNOSIS — Z3481 Encounter for supervision of other normal pregnancy, first trimester: Secondary | ICD-10-CM | POA: Diagnosis not present

## 2016-07-26 DIAGNOSIS — Z113 Encounter for screening for infections with a predominantly sexual mode of transmission: Secondary | ICD-10-CM | POA: Diagnosis not present

## 2016-07-31 ENCOUNTER — Encounter: Payer: Self-pay | Admitting: Obstetrics and Gynecology

## 2016-07-31 DIAGNOSIS — O09811 Supervision of pregnancy resulting from assisted reproductive technology, first trimester: Secondary | ICD-10-CM | POA: Insufficient documentation

## 2016-07-31 NOTE — Assessment & Plan Note (Signed)
Will need fetal echocardiogram 20-22 weeks

## 2016-08-01 ENCOUNTER — Other Ambulatory Visit (INDEPENDENT_AMBULATORY_CARE_PROVIDER_SITE_OTHER): Payer: 59

## 2016-08-01 ENCOUNTER — Ambulatory Visit (INDEPENDENT_AMBULATORY_CARE_PROVIDER_SITE_OTHER): Payer: 59 | Admitting: Obstetrics and Gynecology

## 2016-08-01 ENCOUNTER — Other Ambulatory Visit: Payer: Self-pay | Admitting: Obstetrics and Gynecology

## 2016-08-01 ENCOUNTER — Encounter: Payer: Self-pay | Admitting: Obstetrics and Gynecology

## 2016-08-01 VITALS — BP 106/68 | Wt 137.0 lb

## 2016-08-01 DIAGNOSIS — Z3A12 12 weeks gestation of pregnancy: Secondary | ICD-10-CM

## 2016-08-01 DIAGNOSIS — O3680X0 Pregnancy with inconclusive fetal viability, not applicable or unspecified: Secondary | ICD-10-CM | POA: Diagnosis not present

## 2016-08-01 DIAGNOSIS — O09811 Supervision of pregnancy resulting from assisted reproductive technology, first trimester: Secondary | ICD-10-CM

## 2016-08-01 NOTE — Progress Notes (Signed)
    Routine Prenatal Care Visit  Subjective  Fetal Movement? no Contractions? no Leaking Fluid? no Vaginal Bleeding? no  Objective   Vitals:   08/01/16 0945  BP: 106/68     Urine dipstick shows negative for all components.  General: NAD Pumonary: no increased work of breathing Abdomen: gravid, non-tender, + FHT Extremities: no edema Psychiatric: mood appropriate, affect full      Assessment   36 y.o. G2P1001 at 3935w2d by  02/11/2017, by Ultrasound presenting for routine prenatal visit  Pregnancy #2 Problems (from 05/09/16 to present)    No problems associated with this episode.       Plan   Problem List Items Addressed This Visit      Other   Encounter for supervision of pregnancy resulting from assisted reproductive technology in first trimester - Primary    Will need fetal echocardiogram 20-22 weeks      Relevant Orders   US OB Comp + 14 Wk    Other Visit Diagnoses    [redacted] weeks gestation of pregnancy         No further vaginal bleeding, normal scan today [facility]

## 2016-08-01 NOTE — Progress Notes (Signed)
Dating scan today/fatigue

## 2016-08-06 LAB — HM PAP SMEAR
HPV APTIMA: NEGATIVE
PAP SMEAR: NEGATIVE

## 2016-09-05 ENCOUNTER — Ambulatory Visit (INDEPENDENT_AMBULATORY_CARE_PROVIDER_SITE_OTHER): Payer: 59 | Admitting: Obstetrics and Gynecology

## 2016-09-05 ENCOUNTER — Other Ambulatory Visit: Payer: Self-pay | Admitting: Obstetrics and Gynecology

## 2016-09-05 ENCOUNTER — Other Ambulatory Visit
Admission: RE | Admit: 2016-09-05 | Discharge: 2016-09-05 | Disposition: A | Discharge status: Home / Self Care | Payer: 59 | Source: Ambulatory Visit | Attending: Obstetrics and Gynecology | Admitting: Obstetrics and Gynecology

## 2016-09-05 VITALS — BP 114/70 | Wt 137.0 lb

## 2016-09-05 DIAGNOSIS — Z3A17 17 weeks gestation of pregnancy: Secondary | ICD-10-CM

## 2016-09-05 DIAGNOSIS — O09811 Supervision of pregnancy resulting from assisted reproductive technology, first trimester: Secondary | ICD-10-CM | POA: Insufficient documentation

## 2016-09-05 LAB — CBC
HCT: 38.6 % (ref 35.0–47.0)
Hemoglobin: 13.2 g/dL (ref 12.0–16.0)
MCH: 29.4 pg (ref 26.0–34.0)
MCHC: 34.2 g/dL (ref 32.0–36.0)
MCV: 85.9 fL (ref 80.0–100.0)
PLATELETS: 251 10*3/uL (ref 150–440)
RBC: 4.5 MIL/uL (ref 3.80–5.20)
RDW: 12.5 % (ref 11.5–14.5)
WBC: 10 10*3/uL (ref 3.6–11.0)

## 2016-09-05 LAB — PROTIME-INR
INR: 0.95
PROTHROMBIN TIME: 12.7 s (ref 11.4–15.2)

## 2016-09-05 LAB — APTT: aPTT: 28 seconds (ref 24–36)

## 2016-09-05 MED ORDER — ENOXAPARIN SODIUM 40 MG/0.4ML ~~LOC~~ SOLN: 40.0000 mg | SUBCUTANEOUS | 0 refills | Status: DC

## 2016-09-05 NOTE — Progress Notes (Signed)
No vb. No lof. u/s already scheduled for nv.  Pt with easy bruising. Requests CBC, PT/PTT

## 2016-09-05 NOTE — Progress Notes (Signed)
Reviewed normal coags, patient going on 14-hr flight to [place] given hypercoagulable state in pregnancy and prolonged immobilization [place]  SQ Lovenox for flight

## 2016-09-20 ENCOUNTER — Encounter: Payer: 59 | Admitting: Certified Nurse Midwife

## 2016-09-28 ENCOUNTER — Ambulatory Visit (INDEPENDENT_AMBULATORY_CARE_PROVIDER_SITE_OTHER): Payer: 59

## 2016-09-28 ENCOUNTER — Ambulatory Visit (INDEPENDENT_AMBULATORY_CARE_PROVIDER_SITE_OTHER): Payer: 59 | Admitting: Obstetrics and Gynecology

## 2016-09-28 VITALS — BP 104/60 | Wt 139.0 lb

## 2016-09-28 DIAGNOSIS — O09812 Supervision of pregnancy resulting from assisted reproductive technology, second trimester: Secondary | ICD-10-CM | POA: Insufficient documentation

## 2016-09-28 DIAGNOSIS — O09811 Supervision of pregnancy resulting from assisted reproductive technology, first trimester: Secondary | ICD-10-CM | POA: Diagnosis not present

## 2016-09-28 DIAGNOSIS — O09819 Supervision of pregnancy resulting from assisted reproductive technology, unspecified trimester: Secondary | ICD-10-CM | POA: Insufficient documentation

## 2016-09-28 DIAGNOSIS — Z3A20 20 weeks gestation of pregnancy: Secondary | ICD-10-CM

## 2016-09-28 NOTE — Progress Notes (Signed)
U/S follow up

## 2016-09-28 NOTE — Progress Notes (Signed)
    Routine Prenatal Care Visit  Subjective  Fetal Movement? yes Contractions? no Leaking Fluid? no Vaginal Bleeding? no  Objective   Vitals:   09/28/16 1458  BP: 104/60    @ Urine dipstick shows negative for all components.  General: NAD Pumonary: no increased work of breathing Abdomen: gravid, non-tender, fetal heart tones 150BPM Extremities: no edema Psychiatric: mood appropriate, affect full   Assessment   36 y.o. 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 to assisted reproductive technology 09/28/2016 by Vena Austria, MD No   Overview Signed 09/28/2016  5:52 PM by Vena Austria, MD    [facility]inic [facility] Prenatal Labs  Dating Embryo Transfer Blood type:   B positive  Genetic Screen Preimplantation genetics Antibody: Negative  Anatomic [place] 09/28/16 Norma Rubella:   Immune Varicella: Non-Immune  Fetal Echocardiogram 20-23 weeks RPR:   NR  Rhogam  HBsAg:   neg  TDaP vaccine                       Flu Shot: HIV:   neg  Baby Food                                GBS:   Contraception  Pap: 11/12/2014  CBB     CS/VBAC    Support Person                   Plan   Problem List Items Addressed This Visit      Other   High risk pregnancy due to assisted reproductive technology in second trimester   Relevant Orders   Ambulatory referral to Pediatric Cardiology    Other Visit Diagnoses    [redacted] weeks gestation of pregnancy    -  Primary   Relevant Orders   Ambulatory referral to Pediatric Cardiology     - verify fetal echocardiogram ordered - Urogynecology referral pending for history of deep second degree (considering 1LTCS for delivery).  G1 was operative delivery 6lbs4oz (forceps for fetal distress) - Anatomy screen today complete

## 2016-10-11 DIAGNOSIS — O09812 Supervision of pregnancy resulting from assisted reproductive technology, second trimester: Secondary | ICD-10-CM | POA: Diagnosis not present

## 2016-10-11 DIAGNOSIS — Z8249 Family history of ischemic heart disease and other diseases of the circulatory system: Secondary | ICD-10-CM | POA: Diagnosis not present

## 2016-11-07 ENCOUNTER — Ambulatory Visit (INDEPENDENT_AMBULATORY_CARE_PROVIDER_SITE_OTHER): Payer: 59 | Admitting: Obstetrics and Gynecology

## 2016-11-07 VITALS — BP 98/76 | Wt 144.0 lb

## 2016-11-07 DIAGNOSIS — O09812 Supervision of pregnancy resulting from assisted reproductive technology, second trimester: Secondary | ICD-10-CM

## 2016-11-07 DIAGNOSIS — Z3A26 26 weeks gestation of pregnancy: Secondary | ICD-10-CM

## 2016-11-07 DIAGNOSIS — O09819 Supervision of pregnancy resulting from assisted reproductive technology, unspecified trimester: Secondary | ICD-10-CM

## 2016-11-07 NOTE — Progress Notes (Signed)
ROB

## 2016-11-21 DIAGNOSIS — S39093A Other injury of muscle, fascia and tendon of pelvis, initial encounter: Secondary | ICD-10-CM | POA: Diagnosis not present

## 2016-11-21 DIAGNOSIS — Z331 Pregnant state, incidental: Secondary | ICD-10-CM | POA: Diagnosis not present

## 2016-11-21 DIAGNOSIS — N816 Rectocele: Secondary | ICD-10-CM | POA: Diagnosis not present

## 2016-11-22 ENCOUNTER — Other Ambulatory Visit: Payer: 59

## 2016-11-22 ENCOUNTER — Ambulatory Visit (INDEPENDENT_AMBULATORY_CARE_PROVIDER_SITE_OTHER): Payer: 59 | Admitting: Obstetrics and Gynecology

## 2016-11-22 VITALS — BP 102/70 | Wt 146.0 lb

## 2016-11-22 DIAGNOSIS — O09812 Supervision of pregnancy resulting from assisted reproductive technology, second trimester: Secondary | ICD-10-CM | POA: Diagnosis not present

## 2016-11-22 DIAGNOSIS — Z3A28 28 weeks gestation of pregnancy: Secondary | ICD-10-CM

## 2016-11-22 DIAGNOSIS — O09819 Supervision of pregnancy resulting from assisted reproductive technology, unspecified trimester: Secondary | ICD-10-CM

## 2016-11-22 DIAGNOSIS — Z3A26 26 weeks gestation of pregnancy: Secondary | ICD-10-CM

## 2016-11-22 NOTE — Progress Notes (Signed)
Pos PNVs. No VB, LOF. Doing well. No complaints. 28 wk labs today. Breast/unsure about BC. RTO in 2 wks.

## 2016-11-22 NOTE — Progress Notes (Signed)
28 week labs today. No vb. No lof.  

## 2016-11-23 ENCOUNTER — Encounter: Payer: Self-pay | Admitting: Obstetrics and Gynecology

## 2016-11-23 LAB — 28 WEEK RH+PANEL
Basophils Absolute: 0 10*3/uL (ref 0.0–0.2)
Basos: 0 %
EOS (ABSOLUTE): 0 10*3/uL (ref 0.0–0.4)
Eos: 1 %
Gestational Diabetes Screen: 113 mg/dL (ref 65–139)
HIV SCREEN 4TH GENERATION: NONREACTIVE
Hematocrit: 34 % (ref 34.0–46.6)
Hemoglobin: 11.2 g/dL (ref 11.1–15.9)
IMMATURE GRANS (ABS): 0 10*3/uL (ref 0.0–0.1)
Immature Granulocytes: 0 %
LYMPHS: 24 %
Lymphocytes Absolute: 2 10*3/uL (ref 0.7–3.1)
MCH: 28.9 pg (ref 26.6–33.0)
MCHC: 32.9 g/dL (ref 31.5–35.7)
MCV: 88 fL (ref 79–97)
MONOCYTES: 3 %
Monocytes Absolute: 0.3 10*3/uL (ref 0.1–0.9)
NEUTROS ABS: 6.2 10*3/uL (ref 1.4–7.0)
Neutrophils: 72 %
Platelets: 236 10*3/uL (ref 150–379)
RBC: 3.87 x10E6/uL (ref 3.77–5.28)
RDW: 12.9 % (ref 12.3–15.4)
RPR Ser Ql: NONREACTIVE
WBC: 8.5 10*3/uL (ref 3.4–10.8)

## 2016-12-07 ENCOUNTER — Ambulatory Visit (INDEPENDENT_AMBULATORY_CARE_PROVIDER_SITE_OTHER): Payer: 59 | Admitting: Advanced Practice Midwife

## 2016-12-07 VITALS — BP 114/70 | Wt 150.0 lb

## 2016-12-07 DIAGNOSIS — Z3A30 30 weeks gestation of pregnancy: Secondary | ICD-10-CM

## 2016-12-07 NOTE — Progress Notes (Signed)
Doing well. Good fetal movement. No Ctx's, LOF, VB. FH measures 2 cm below GA. 1st baby was 6 pounds. RTC in 2 wks.

## 2016-12-07 NOTE — Progress Notes (Signed)
No vb. No lof.  

## 2016-12-21 ENCOUNTER — Encounter: Payer: 59 | Admitting: Advanced Practice Midwife

## 2016-12-21 ENCOUNTER — Telehealth: Payer: Self-pay

## 2016-12-21 DIAGNOSIS — Z3A36 36 weeks gestation of pregnancy: Secondary | ICD-10-CM | POA: Diagnosis not present

## 2016-12-21 DIAGNOSIS — K6281 Anal sphincter tear (healed) (nontraumatic) (old): Secondary | ICD-10-CM | POA: Diagnosis not present

## 2016-12-21 NOTE — Telephone Encounter (Signed)
FMLA/DISABILITY form for [employer] filled out and given to [place] for procesing

## 2016-12-25 ENCOUNTER — Ambulatory Visit (INDEPENDENT_AMBULATORY_CARE_PROVIDER_SITE_OTHER): Payer: 59 | Admitting: Obstetrics and Gynecology

## 2016-12-25 VITALS — BP 108/74 | Wt 151.0 lb

## 2016-12-25 DIAGNOSIS — Z3A33 33 weeks gestation of pregnancy: Secondary | ICD-10-CM

## 2016-12-25 DIAGNOSIS — O26843 Uterine size-date discrepancy, third trimester: Secondary | ICD-10-CM

## 2016-12-25 DIAGNOSIS — O368130 Decreased fetal movements, third trimester, not applicable or unspecified: Secondary | ICD-10-CM | POA: Diagnosis not present

## 2016-12-25 NOTE — Progress Notes (Signed)
ROB  Discuss fetal movement

## 2016-12-26 NOTE — Progress Notes (Signed)
    Routine Prenatal Care Visit  Subjective  Fetal Movement? Yes  Contractions? no Leaking Fluid? no Vaginal Bleeding? No  Feels movement has never been very vigorous this pregnancy, particularly in comparison to her prior pregnancy  Objective   Vitals:   12/25/16 1400  BP: 108/74  Wt gain to date 15lbs  General: NAD Pumonary: no increased work of breathing Abdomen: gravid, non-tender, fundal height 31, fetal heart tones 140BPM Extremities: no edema Psychiatric: mood appropriate, affect full  Baseline: 140 Variability: moderate Accelerations: present Decelerations: absent Tocometry: N/A The patient was monitored for 30 minutes, fetal heart rate tracing was deemed reactive, category I tracing,   Assessment   36 y.o. G2P1001 at 3528w0d by  02/13/2017, Alternate EDD Entry presenting for routine prenatal visit  Pregnancy #2 Problems (from 05/09/16 to present)    Problem Noted Resolved   High risk pregnancy due to assisted reproductive technology 09/28/2016 by Vena AustriaStaebler, Caitrin Pendergraph, MD No   Overview Addendum 11/23/2016  9:29 AM by Vena AustriaStaebler, Lailyn Appelbaum, MD    [facility] Prenatal Labs  Dating Embryo Transfer Blood type:   B positive  Genetic Screen Preimplantation genetics Antibody: Negative  Anatomic US 09/28/16 Normal Rubella:   Immune Varicella: Non-Immune  Fetal Echocardiogram 20-23 weeks -  normal RPR:   NR  Rhogam  HBsAg:   neg  TDaP vaccine                       Flu Shot: HIV:   neg  Baby Food                 Breast              GBS:   Contraception     Unsure Pap: 11/12/2014  CBB   1-hr: 113  CS/VBAC    Support Person                   Plan   Problem List Items Addressed This Visit    None    Visit Diagnoses    [redacted] weeks gestation of pregnancy    -  Primary   Relevant Orders   US OB Follow Up   Fundal height low for dates, third trimester       Relevant Orders   US OB Follow Up   Decreased fetal movements in third trimester, single or unspecified fetus          - Will obtain growth scan to evaluate growth.  Fundal height within limits but has dropped a little.  Also still contemplating possible primary cesarean section but leaning more towards vaginal delivery after evaluation of sphincter complex by urogyn.

## 2016-12-27 ENCOUNTER — Telehealth: Payer: Self-pay | Admitting: Obstetrics and Gynecology

## 2016-12-27 NOTE — Telephone Encounter (Signed)
Per AMS- Please schedule Ultrasound- Fundal height low for Dated, third trimester and ROB. I called and left voicemail for patient to call back to be schedule

## 2017-01-09 ENCOUNTER — Other Ambulatory Visit (INDEPENDENT_AMBULATORY_CARE_PROVIDER_SITE_OTHER): Payer: 59

## 2017-01-09 ENCOUNTER — Other Ambulatory Visit: Payer: Self-pay | Admitting: Obstetrics and Gynecology

## 2017-01-09 ENCOUNTER — Ambulatory Visit: Payer: 59

## 2017-01-09 ENCOUNTER — Ambulatory Visit (INDEPENDENT_AMBULATORY_CARE_PROVIDER_SITE_OTHER): Payer: 59 | Admitting: Obstetrics and Gynecology

## 2017-01-09 VITALS — BP 108/68 | Wt 152.0 lb

## 2017-01-09 DIAGNOSIS — O09819 Supervision of pregnancy resulting from assisted reproductive technology, unspecified trimester: Secondary | ICD-10-CM

## 2017-01-09 DIAGNOSIS — Z3689 Encounter for other specified antenatal screening: Secondary | ICD-10-CM

## 2017-01-09 DIAGNOSIS — Z3A35 35 weeks gestation of pregnancy: Secondary | ICD-10-CM

## 2017-01-09 NOTE — Progress Notes (Signed)
ROB/no urine provided U/S today

## 2017-01-09 NOTE — Progress Notes (Signed)
Growth scan today EFW 17%, slightly small AC but reviewing all measurements symmetric growth, S/D normal, with G1 weighing 6lbs 4oz

## 2017-01-15 ENCOUNTER — Encounter: Payer: 59 | Admitting: Obstetrics and Gynecology

## 2017-01-16 ENCOUNTER — Ambulatory Visit (INDEPENDENT_AMBULATORY_CARE_PROVIDER_SITE_OTHER): Payer: 59 | Admitting: Obstetrics and Gynecology

## 2017-01-16 VITALS — BP 100/56 | Wt 154.0 lb

## 2017-01-16 DIAGNOSIS — Z3685 Encounter for antenatal screening for Streptococcus B: Secondary | ICD-10-CM | POA: Diagnosis not present

## 2017-01-16 DIAGNOSIS — O09819 Supervision of pregnancy resulting from assisted reproductive technology, unspecified trimester: Secondary | ICD-10-CM

## 2017-01-16 DIAGNOSIS — Z23 Encounter for immunization: Secondary | ICD-10-CM | POA: Diagnosis not present

## 2017-01-16 DIAGNOSIS — Z3A36 36 weeks gestation of pregnancy: Secondary | ICD-10-CM

## 2017-01-16 NOTE — Progress Notes (Signed)
[ ]   GBS

## 2017-01-16 NOTE — Progress Notes (Signed)
    Routine Prenatal Care Visit  Subjective  Carla Holt is a 36 y.o. G2P1001 at 6319w0d being seen today for ongoing prenatal care.  She is currently monitored for the following issues for this high-risk pregnancy and has Kidney stones and High risk pregnancy due to assisted reproductive technology on her problem list.  ----------------------------------------------------------------------------------- Patient reports no complaints.    .  .   . Denies leaking of fluid.  ----------------------------------------------------------------------------------- The following portions of the patient's history were reviewed and updated as appropriate: allergies, current medications, past family history, past medical history, past social history, past surgical history and problem list. Problem list updated.   Objective  Blood pressure (!) 100/56, weight 154 lb (69.9 kg), last menstrual period 04/09/2016, unknown if currently breastfeeding. Pregravid weight 136 lb (61.7 kg) Total Weight Gain 18 lb (8.165 kg) Urinalysis: Urine Protein: Negative Urine Glucose: Negative  Fetal Status:           General:  Alert, oriented and cooperative. Patient is in no acute distress.  Skin: Skin is warm and dry. No rash noted.   Cardiovascular: Normal heart rate noted  Respiratory: Normal respiratory effort, no problems with respiration noted  Abdomen: Soft, gravid, appropriate for gestational age.       Pelvic:  Cervical exam performed        Extremities: Normal range of motion.     ental Status: Normal mood and affect. Normal behavior. Normal judgment and thought content.     Assessment   36 y.o. G2P1001 at 7619w0d by  02/13/2017, Alternate EDD Entry presenting for routine prenatal visit  Plan   Pregnancy #2 Problems (from 05/09/16 to present)    Problem Noted Resolved   High risk pregnancy due to assisted reproductive technology 09/28/2016 by Vena AustriaStaebler, Rosellen Lichtenberger, MD No   Overview Addendum 01/15/2017 11:21 PM  by Vena AustriaStaebler, Maytte Jacot, MD    [facility] Prenatal Labs  Dating Embryo Transfer Blood type:   B positive  Genetic Screen Preimplantation genetics Antibody: Negative  Anatomic US 09/28/16 Normal Rubella:   Immune Varicella: Non-Immune  Fetal Echocardiogram 20-23 weeks -  normal RPR:   NR  Rhogam Not indicated HBsAg:   neg  TDaP vaccine Received at work    HIV:   neg  Baby Food Breast              GBS:   Contraception Unsure Pap: 11/12/2014  CBB   1-hr: 113  CS/VBAC N/A Forceps delivery with 2nd degree - normal endo anal ultrasound cleared for vaginal delivery  Support Person Drema BalzarineMeezan (husband) Pelvis tested to 6lbs 4oz                 Term labor symptoms and general obstetric precautions including but not limited to vaginal bleeding, contractions, leaking of fluid and fetal movement were reviewed in detail with the patient. Please refer to After Visit Summary for other counseling recommendations.   Return in about 1 week (around 01/23/2017) for ROB Dominyk Law.

## 2017-01-16 NOTE — Addendum Note (Signed)
Addended by: Lorrene ReidSTAEBLER, Hawa Henly M on: 01/16/2017 09:02 AM   Modules accepted: Orders

## 2017-01-18 LAB — STREP GP B NAA: Strep Gp B NAA: NEGATIVE

## 2017-01-21 ENCOUNTER — Ambulatory Visit (INDEPENDENT_AMBULATORY_CARE_PROVIDER_SITE_OTHER): Payer: 59 | Admitting: Obstetrics and Gynecology

## 2017-01-21 VITALS — BP 112/82 | Wt 156.0 lb

## 2017-01-21 DIAGNOSIS — O09819 Supervision of pregnancy resulting from assisted reproductive technology, unspecified trimester: Secondary | ICD-10-CM

## 2017-01-21 DIAGNOSIS — Z3A36 36 weeks gestation of pregnancy: Secondary | ICD-10-CM

## 2017-01-21 NOTE — Progress Notes (Signed)
ROB

## 2017-01-21 NOTE — Progress Notes (Signed)
    Routine Prenatal Care Visit  Subjective  Carla Holt is a 36 y.o. G2P1001 at [redacted]w[redacted]d being seen today for ongoing prenatal care.  She is currently monitored for the following issues for this low-risk pregnancy and has Kidney stones and High risk pregnancy due to assisted reproductive technology on her problem list.  ----------------------------------------------------------------------------------- Patient reports no complaints.   Contractions: Not present. Vag. Bleeding: None.  Movement: Present. Denies leaking of fluid.  ----------------------------------------------------------------------------------- The following portions of the patient's history were reviewed and updated as appropriate: allergies, current medications, past family history, past medical history, past social history, past surgical history and problem list. Problem list updated.   Objective  Blood pressure 112/82, weight 156 lb (70.8 kg), last menstrual period 04/09/2016, unknown if currently breastfeeding. Pregravid weight 136 lb (61.7 kg) Total Weight Gain 20 lb (9.072 kg) Urinalysis:      Fetal Status: Fetal Heart Rate (bpm): 140 Fundal Height: 35 cm Movement: Present     General:  Alert, oriented and cooperative. Patient is in no acute distress.  Skin: Skin is warm and dry. No rash noted.   Cardiovascular: Normal heart rate noted  Respiratory: Normal respiratory effort, no problems with respiration noted  Abdomen: Soft, gravid, appropriate for gestational age. Pain/Pressure: Present     Pelvic:  Cervical exam deferred        Extremities: Normal range of motion.     ental Status: Normal mood and affect. Normal behavior. Normal judgment and thought content.     Assessment   36 y.o. G2P1001 at [redacted]w[redacted]d by  02/13/2017, Alternate EDD Entry presenting for routine prenatal visit  Plan   Pregnancy #2 Problems (from 05/09/16 to present)    Problem Noted Resolved   High risk pregnancy due to assisted reproductive  technology 09/28/2016 by Vena Austria, MD No   Overview Addendum 01/21/2017  8:22 AM by Vena Austria, MD    [facility] Prenatal Labs  Dating Embryo Transfer Blood type:   B positive  Genetic Screen Preimplantation genetics Antibody: Negative  Anatomic [place] 09/28/16 Normal Rubella:   Immune Varicella: Non-Immune  Fetal Echocardiogram 20-23 weeks -  normal RPR:   NR  Rhogam Not indicated HBsAg:   neg  TDaP vaccine Received at work    HIV:   neg  Baby Food Breast              [id number]  Contraception Unsure Pap: 11/12/2014  CBB   1-hr: 113  CS/VBAC N/A Forceps delivery with 2nd degree - normal endo anal ultrasound cleared for vaginal delivery  Support Person Drema Balzarine (husband) Pelvis tested to 6lbs 4oz                 Term labor symptoms and general obstetric precautions including but not limited to vaginal bleeding, contractions, leaking of fluid and fetal movement were reviewed in detail with the patient. Please refer to After Visit Summary for other counseling recommendations.   Return in about 1 week (around 01/28/2017) for ROB Jean Rosenthal.

## 2017-01-28 ENCOUNTER — Ambulatory Visit (INDEPENDENT_AMBULATORY_CARE_PROVIDER_SITE_OTHER): Payer: 59 | Admitting: Obstetrics and Gynecology

## 2017-01-28 VITALS — BP 114/74 | Wt 158.0 lb

## 2017-01-28 DIAGNOSIS — Z3A37 37 weeks gestation of pregnancy: Secondary | ICD-10-CM

## 2017-01-28 DIAGNOSIS — O09819 Supervision of pregnancy resulting from assisted reproductive technology, unspecified trimester: Secondary | ICD-10-CM

## 2017-01-28 NOTE — Progress Notes (Signed)
  Routine Prenatal Care Visit  Subjective  Carla Holt is a 36 y.o. G2P1001 at [redacted]w[redacted]d being seen today for ongoing prenatal care.  She is currently monitored for the following issues for this high-risk pregnancy and has Kidney stones and High risk pregnancy due to assisted reproductive technology on her problem list.  ----------------------------------------------------------------------------------- Patient reports no complaints.   Contractions: Not present. Vag. Bleeding: None.  Movement: Present. Denies leaking of fluid.  ----------------------------------------------------------------------------------- The following portions of the patient's history were reviewed and updated as appropriate: allergies, current medications, past family history, past medical history, past social history, past surgical history and problem list. Problem list updated.  Objective  Blood pressure 114/74, weight 158 lb (71.7 kg), last menstrual period 04/09/2016, unknown if currently breastfeeding. Pregravid weight 136 lb (61.7 kg) Total Weight Gain 22 lb (9.979 kg) Urinalysis: Urine Protein: Negative Urine Glucose: Negative  Fetal Status: Fetal Heart Rate (bpm): 140 Fundal Height: 35 cm Movement: Present     General:  Alert, oriented and cooperative. Patient is in no acute distress.  Skin: Skin is warm and dry. No rash noted.   Cardiovascular: Normal heart rate noted  Respiratory: Normal respiratory effort, no problems with respiration noted  Abdomen: Soft, gravid, appropriate for gestational age. Pain/Pressure: Absent     Pelvic:  Cervical exam deferred        Extremities: Normal range of motion.     Mental Status: Normal mood and affect. Normal behavior. Normal judgment and thought content.   Assessment   36 y.o. G2P1001 at [redacted]w[redacted]d by  02/13/2017, Alternate EDD Entry presenting for routine prenatal visit  Plan   Pregnancy #2 Problems (from 05/09/16 to present)    Problem Noted Resolved   High risk  pregnancy due to assisted reproductive technology 09/28/2016 by Vena Austria, MD No   Overview Addendum 01/21/2017  8:22 AM by Vena Austria, MD    [facility] Prenatal Labs  Dating Embryo Transfer Blood type:   B positive  Genetic Screen Preimplantation genetics Antibody: Negative  Anatomic [place] 09/28/16 Normal Rubella:   Immune Varicella: Non-Immune  Fetal Echocardiogram 20-23 weeks -  normal RPR:   NR  Rhogam Not indicated HBsAg:   neg  TDaP vaccine Received at work    HIV:   neg  Baby Food Breast              [id number]  Contraception Unsure Pap: 11/12/2014  CBB   1-hr: 113  CS/VBAC N/A Forceps delivery with 2nd degree - normal endo anal ultrasound cleared for vaginal delivery  Support Person Drema Balzarine (husband) Pelvis tested to 6lbs 4oz              Term labor symptoms and general obstetric precautions including but not limited to vaginal bleeding, contractions, leaking of fluid and fetal movement were reviewed in detail with the patient. Please refer to After Visit Summary for other counseling recommendations.   Return in about 1 week (around 02/04/2017) for Routine Prenatal Appointment.  Thomasene Mohair, MD  01/28/2017 11:15 AM

## 2017-01-29 ENCOUNTER — Telehealth: Payer: Self-pay

## 2017-01-29 NOTE — Telephone Encounter (Signed)
FMLA/DISABILITY form for [employer] filled out and given to [place] for processing.,

## 2017-01-31 ENCOUNTER — Inpatient Hospital Stay
Admission: EM | Admit: 2017-01-31 | Discharge: 2017-02-03 | DRG: 775 | Disposition: A | Discharge status: Home / Self Care | Payer: 59 | Attending: Obstetrics and Gynecology | Admitting: Obstetrics and Gynecology

## 2017-01-31 DIAGNOSIS — Z3490 Encounter for supervision of normal pregnancy, unspecified, unspecified trimester: Secondary | ICD-10-CM

## 2017-01-31 DIAGNOSIS — Z3A38 38 weeks gestation of pregnancy: Secondary | ICD-10-CM

## 2017-01-31 NOTE — OB Triage Note (Signed)
2255 Pt to OBS 4 with c/o contractions since 0730 this morning that became worse at 2130.  EFM explained and applied.  No acute distress noted will continue to monitor. S.O at bedside.

## 2017-02-01 ENCOUNTER — Inpatient Hospital Stay: Payer: 59 | Admitting: Anesthesiology

## 2017-02-01 DIAGNOSIS — Z3A38 38 weeks gestation of pregnancy: Secondary | ICD-10-CM | POA: Diagnosis not present

## 2017-02-01 DIAGNOSIS — Z3493 Encounter for supervision of normal pregnancy, unspecified, third trimester: Secondary | ICD-10-CM | POA: Diagnosis present

## 2017-02-01 LAB — CBC
HCT: 34 % — ABNORMAL LOW (ref 35.0–47.0)
HEMATOCRIT: 35.2 % (ref 35.0–47.0)
HEMOGLOBIN: 12.4 g/dL (ref 12.0–16.0)
Hemoglobin: 11.6 g/dL — ABNORMAL LOW (ref 12.0–16.0)
MCH: 28.9 pg (ref 26.0–34.0)
MCH: 28.6 pg (ref 26.0–34.0)
MCHC: 35.2 g/dL (ref 32.0–36.0)
MCHC: 34 g/dL (ref 32.0–36.0)
MCV: 82.2 fL (ref 80.0–100.0)
MCV: 84.3 fL (ref 80.0–100.0)
PLATELETS: 234 10*3/uL (ref 150–440)
Platelets: 246 10*3/uL (ref 150–440)
RBC: 4.29 MIL/uL (ref 3.80–5.20)
RBC: 4.04 MIL/uL (ref 3.80–5.20)
RDW: 13.1 % (ref 11.5–14.5)
RDW: 13.4 % (ref 11.5–14.5)
WBC: 13.9 10*3/uL — AB (ref 3.6–11.0)
WBC: 16.6 10*3/uL — AB (ref 3.6–11.0)

## 2017-02-01 LAB — TYPE AND SCREEN
ABO/RH(D): B POS
ANTIBODY SCREEN: NEGATIVE

## 2017-02-01 MED ORDER — SIMETHICONE 80 MG PO CHEW
80.0000 mg | CHEWABLE_TABLET | ORAL | Status: DC | PRN
Start: 2017-02-01 — End: 2017-02-03

## 2017-02-01 MED ORDER — OXYTOCIN 40 UNITS IN LACTATED RINGERS INFUSION - SIMPLE MED
INTRAVENOUS | Status: AC
  Filled 2017-02-01: qty 1000

## 2017-02-01 MED ORDER — OXYCODONE-ACETAMINOPHEN 5-325 MG PO TABS: 1.0000 | ORAL_TABLET | ORAL | Status: DC | PRN

## 2017-02-01 MED ORDER — BENZOCAINE-MENTHOL 20-0.5 % EX AERO
1.0000 "application " | INHALATION_SPRAY | CUTANEOUS | Status: DC | PRN
  Administered 2017-02-02: 1 via TOPICAL
  Filled 2017-02-01: qty 56

## 2017-02-01 MED ORDER — EPHEDRINE 5 MG/ML INJ
10.0000 mg | INTRAVENOUS | Status: DC | PRN
  Filled 2017-02-01: qty 2

## 2017-02-01 MED ORDER — ONDANSETRON HCL 4 MG/2ML IJ SOLN: 4.0000 mg | Freq: Four times a day (QID) | INTRAMUSCULAR | Status: DC | PRN

## 2017-02-01 MED ORDER — ONDANSETRON HCL 4 MG PO TABS: 4.0000 mg | ORAL_TABLET | ORAL | Status: DC | PRN

## 2017-02-01 MED ORDER — SENNOSIDES-DOCUSATE SODIUM 8.6-50 MG PO TABS
2.0000 | ORAL_TABLET | ORAL | Status: DC
  Administered 2017-02-02 (×2): 2 via ORAL
  Filled 2017-02-01 (×2): qty 2

## 2017-02-01 MED ORDER — LIDOCAINE HCL (PF) 1 % IJ SOLN
INTRAMUSCULAR | Status: DC | PRN
  Administered 2017-02-01: 3 mL

## 2017-02-01 MED ORDER — ACETAMINOPHEN 325 MG PO TABS: 650.0000 mg | ORAL_TABLET | ORAL | Status: DC | PRN

## 2017-02-01 MED ORDER — BUPIVACAINE HCL (PF) 0.25 % IJ SOLN
INTRAMUSCULAR | Status: DC | PRN
  Administered 2017-02-01: 1 mL via INTRATHECAL

## 2017-02-01 MED ORDER — OXYCODONE-ACETAMINOPHEN 5-325 MG PO TABS: 2.0000 | ORAL_TABLET | ORAL | Status: DC | PRN

## 2017-02-01 MED ORDER — DIPHENHYDRAMINE HCL 25 MG PO CAPS: 25.0000 mg | ORAL_CAPSULE | Freq: Four times a day (QID) | ORAL | Status: DC | PRN

## 2017-02-01 MED ORDER — BUTORPHANOL TARTRATE 1 MG/ML IJ SOLN: 1.0000 mg | INTRAMUSCULAR | Status: DC | PRN

## 2017-02-01 MED ORDER — LIDOCAINE HCL (PF) 1 % IJ SOLN
INTRAMUSCULAR | Status: AC
  Filled 2017-02-01: qty 30

## 2017-02-01 MED ORDER — COCONUT OIL OIL
1.0000 "application " | TOPICAL_OIL | Status: DC | PRN
  Administered 2017-02-02: 1 via TOPICAL
  Filled 2017-02-01: qty 120

## 2017-02-01 MED ORDER — PHENYLEPHRINE 40 MCG/ML (10ML) SYRINGE FOR IV PUSH (FOR BLOOD PRESSURE SUPPORT)
80.0000 ug | PREFILLED_SYRINGE | INTRAVENOUS | Status: DC | PRN
  Filled 2017-02-01: qty 5

## 2017-02-01 MED ORDER — MISOPROSTOL 200 MCG PO TABS
ORAL_TABLET | ORAL | Status: AC
  Filled 2017-02-01: qty 4

## 2017-02-01 MED ORDER — ONDANSETRON HCL 4 MG/2ML IJ SOLN: 4.0000 mg | INTRAMUSCULAR | Status: DC | PRN

## 2017-02-01 MED ORDER — WITCH HAZEL-GLYCERIN EX PADS: 1.0000 "application " | MEDICATED_PAD | CUTANEOUS | Status: DC | PRN

## 2017-02-01 MED ORDER — DIBUCAINE 1 % RE OINT: 1.0000 "application " | TOPICAL_OINTMENT | RECTAL | Status: DC | PRN

## 2017-02-01 MED ORDER — LACTATED RINGERS IV SOLN: 500.0000 mL | INTRAVENOUS | Status: DC | PRN

## 2017-02-01 MED ORDER — DIPHENHYDRAMINE HCL 50 MG/ML IJ SOLN: 12.5000 mg | INTRAMUSCULAR | Status: DC | PRN

## 2017-02-01 MED ORDER — PRENATAL MULTIVITAMIN CH
1.0000 | ORAL_TABLET | Freq: Every day | ORAL | Status: DC
  Administered 2017-02-01 – 2017-02-03 (×3): 1 via ORAL
  Filled 2017-02-01 (×3): qty 1

## 2017-02-01 MED ORDER — LACTATED RINGERS IV SOLN
INTRAVENOUS | Status: DC
  Administered 2017-02-01: 02:00:00 via INTRAVENOUS

## 2017-02-01 MED ORDER — OXYTOCIN BOLUS FROM INFUSION
500.0000 mL | Freq: Once | INTRAVENOUS | Status: AC
  Administered 2017-02-01: 500 [IU] via INTRAVENOUS

## 2017-02-01 MED ORDER — IBUPROFEN 600 MG PO TABS
600.0000 mg | ORAL_TABLET | Freq: Four times a day (QID) | ORAL | Status: DC
  Administered 2017-02-01 – 2017-02-03 (×8): 600 mg via ORAL
  Filled 2017-02-01 (×9): qty 1

## 2017-02-01 MED ORDER — AMMONIA AROMATIC IN INHA
RESPIRATORY_TRACT | Status: AC
  Filled 2017-02-01: qty 10

## 2017-02-01 MED ORDER — OXYTOCIN 40 UNITS IN LACTATED RINGERS INFUSION - SIMPLE MED: 2.5000 [IU]/h | INTRAVENOUS | Status: DC

## 2017-02-01 MED ORDER — LIDOCAINE HCL (PF) 1 % IJ SOLN: 30.0000 mL | INTRAMUSCULAR | Status: DC | PRN

## 2017-02-01 MED ORDER — OXYTOCIN 10 UNIT/ML IJ SOLN
INTRAMUSCULAR | Status: AC
  Filled 2017-02-01: qty 2

## 2017-02-01 MED ORDER — LACTATED RINGERS IV SOLN: 500.0000 mL | Freq: Once | INTRAVENOUS | Status: DC

## 2017-02-01 MED ORDER — FENTANYL 2.5 MCG/ML W/ROPIVACAINE 0.15% IN NS 100 ML EPIDURAL (ARMC)
12.0000 mL/h | EPIDURAL | Status: DC
  Administered 2017-02-01: 12 mL/h via EPIDURAL
  Filled 2017-02-01: qty 100

## 2017-02-01 MED ORDER — SOD CITRATE-CITRIC ACID 500-334 MG/5ML PO SOLN: 30.0000 mL | ORAL | Status: DC | PRN

## 2017-02-01 NOTE — H&P (Signed)
Obstetric H&P   Chief Complaint: Contractions  Prenatal Care Provider: [facility]  History of Present Illness: 36 y.o. G2P1001 [redacted]w[redacted]d by 02/13/2017, Alternate EDD Entry presenting to L&D with contraction off and on since this morning growing stronger and more intense this evening.Marland Kitchen  Positive fetal movement, no LOF, no VB.  Pregravid weight 136 lb (61.7 kg) Total Weight Gain 22 lb (9.979 kg)   Weight 4lbs 11oz at [redacted]w[redacted]d, or 2137g c/w 16/8%ile, given appropriate interval growth would predict weight of 2737g or 6lbs 1oz  Pregnancy #2 Problems (from 05/09/16 to present)    Problem Noted Resolved   High risk pregnancy due to assisted reproductive technology 09/28/2016 by Vena Austria, MD No   Overview Addendum 01/21/2017  8:22 AM by Vena Austria, MD    [facility]inic [facility] Prenatal Labs  Dating Embryo Transfer Blood type:   B positive  Genetic Screen Preimplantation genetics Antibody: Negative  Anatomic [place] 09/28/16 Normal Rubella:   Immune Varicella: Non-Immune  Fetal Echocardiogram 20-23 weeks -  normal RPR:   NR  Rhogam Not indicated HBsAg:   neg  TDaP vaccine Received at work    HIV:   neg  [employer] Breast              [id number]  Contraception Unsure Pap: 11/12/2014  CBB   1-hr: 113  CS/VBAC N/A Forceps delivery with 2nd degree - normal endo anal ultrasound cleared for vaginal delivery  Support Person Drema Balzarine (husband) Pelvis tested to 6lbs 4oz                 Review of Systems: 10 point review of systems negative unless otherwise noted in HPI  Past Medical History: Past Medical History:  Diagnosis Date  . Laceration, obstetrical, second degree    deep    Past Surgical History: History reviewed. No pertinent surgical history.  Family History: Family History  Problem Relation Age of Onset  . Bladder Cancer Neg Hx   . Kidney cancer Neg Hx     Social History: Social History   Social History  . Marital status: Married    Spouse name: N/A  . Number of children:  N/A  . Years of education: N/A   Occupational History  . Not on file.   Social History Main Topics  . Smoking status: Never Smoker  . Smokeless tobacco: Never Used  . Alcohol use No  . Drug use: No  . Sexual activity: Yes    Birth control/ protection: None   Other Topics Concern  . Not on file   Social History Narrative  . No narrative on file    Medications: Prior to Admission medications   Medication Sig Start Date End Date Taking? Authorizing Provider  Prenatal Vit-Fe Fumarate-FA (PRENATAL VITAMIN PO) Take by mouth.   Yes [provider]    Allergies: No Known Allergies  Physical Exam: Vitals: Blood pressure 136/80, pulse 62, temperature 98.5 F (36.9 C), temperature source Oral, resp. rate 18, height 5\' 3"  (1.6 m), weight 158 lb (71.7 kg), last menstrual period 04/09/2016, unknown if currently breastfeeding.  Urine Dip Protein: N/A  FHT: 125. Moderate, +accels, no decels Toco: q57min  General: NAD HEENT: normocephalic, anicteric Pulmonary: No increased work of breathing Cardiovascular: RRR, distal pulses 2+ Abdomen: Gravid, non-tende Genitourinary: Dilation: 2.5 Effacement (%): 70 Cervical Position: Middle Station: -1 Presentation: Vertex Exam by:: Rayne Du RN Extremities: no edema, erythema, or tenderness Neurologic: Grossly intact Psychiatric: mood appropriate, affect full  Labs: No results found for  this or any previous visit (from the past 24 hour(s)).  Assessment: 36 y.o. G2P1001 3349w2d by 02/13/2017, Alternate EDD Entry term labor  Plan: 1) Labor - expectant management  2) Fetus - cat I  3) PNL - see above  4) Immunization History -   There is no immunization history on file for this patient. (received TDAP at work)  5) Disposition - pending delivery

## 2017-02-01 NOTE — Discharge Summary (Signed)
Obstetric Discharge Summary Reason for Admission: onset of labor Prenatal Procedures: none Intrapartum Procedures: spontaneous vaginal delivery Postpartum Procedures: none Complications-Operative and Postpartum: 2nd degree perineal laceration Hemoglobin  Date Value Ref Range Status  02/01/2017 11.6 (L) 12.0 - 16.0 g/dL Final  21/30/865706/21/2018 84.611.2 11.1 - 15.9 g/dL Final   HCT  Date Value Ref Range Status  02/01/2017 34.0 (L) 35.0 - 47.0 % Final   Hematocrit  Date Value Ref Range Status  11/22/2016 34.0 34.0 - 46.6 % Final    Physical Exam:  General: alert, cooperative and no distress Lochia: appropriate Uterine Fundus: firm DVT Evaluation: No evidence of DVT seen on physical exam.  Prenatal Labs: B+, Rubella Immune, Varicella Non-Immune (plans to get at her work), TDAP UTD  Discharge Diagnoses: Term Pregnancy-delivered  Discharge Information: Date: 02/02/2017 Activity: pelvic rest Diet: routine Allergies as of 02/02/2017   No Known Allergies     Medication List    TAKE these medications   PRENATAL VITAMIN PO Take by mouth.            Discharge Care Instructions        Start     Ordered   02/02/17 0000  Call MD for:  temperature >100.4    Comments:  Call MD for temperature >101.0   02/02/17 0938   02/02/17 0000  Call MD for:  persistant nausea and vomiting     02/02/17 0938   02/02/17 0000  Call MD for:  severe uncontrolled pain     02/02/17 0938   02/02/17 0000  Call MD for:  redness, tenderness, or signs of infection (pain, swelling, redness, odor or green/yellow discharge around incision site)     02/02/17 0938   02/02/17 0000  Call MD for:  difficulty breathing, headache or visual disturbances     02/02/17 0938   02/02/17 0000  Call MD for:  persistant dizziness or light-headedness     02/02/17 0938   02/02/17 0000  Activity as tolerated     02/02/17 0938   02/02/17 0000  Sexual acrtivity    Comments:  Please refrain from intercourse until evaluated by  your doctor/midwife at your 6 week visit.   02/02/17 0938   02/02/17 0000  Diet general     02/02/17 0938    Contraception: undecided/progesterone only  Condition: stable  Discharge to: home Follow-up Information    Vena AustriaStaebler, Andreas, MD Follow up in 6 week(s).   Specialty:  Obstetrics and Gynecology Contact information: [address] [place] [id number] [phone]           Newborn Data: Live born female Birth Weight: 5 lb 12.2 oz (2340 g) APGAR: 8, 9 Breastfeeding  Home with mother.  Carla Holt, Carla Holt 02/02/2017, 9:39 AM

## 2017-02-01 NOTE — Progress Notes (Signed)
Dr. Tiburcio PeaHarris notified of pt being on the unit.  Report given G2 P1 at 38.1 weeks contracting q 3-9 minutes mild intensity, SVE 2.5/70/-1.  FHR tracing was having variables and early's when she first got her but tracing is starting to look better. Asked to review tracing order given to monitor pt for 2 hours repeat SVE.

## 2017-02-01 NOTE — Anesthesia Preprocedure Evaluation (Signed)
Anesthesia Evaluation  Patient identified by MRN, date of birth, ID band Patient awake    Reviewed: Allergy & Precautions, NPO status , Patient's Chart, lab work & pertinent test results  History of Anesthesia Complications Negative for: history of anesthetic complications  Airway Mallampati: II  TM Distance: >3 FB Neck ROM: Full    Dental no notable dental hx.    Pulmonary neg pulmonary ROS, neg sleep apnea, neg COPD,    breath sounds clear to auscultation- rhonchi (-) wheezing      Cardiovascular Exercise Tolerance: Good (-) hypertension(-) CAD and (-) Past MI  Rhythm:Regular Rate:Normal - Systolic murmurs and - Diastolic murmurs    Neuro/Psych negative neurological ROS  negative psych ROS   GI/Hepatic negative GI ROS, Neg liver ROS,   Endo/Other  negative endocrine ROSneg diabetes  Renal/GU Renal disease: hx of nephrolithiasis.     Musculoskeletal negative musculoskeletal ROS (+)   Abdominal (+) - obese, Gravid abdomen  Peds  Hematology negative hematology ROS (+)   Anesthesia Other Findings Past Medical History: No date: Laceration, obstetrical, second degree     Comment:  deep   Reproductive/Obstetrics (+) Pregnancy                             Anesthesia Physical Anesthesia Plan  ASA: II  Anesthesia Plan: Epidural   Post-op Pain Management:    Induction:   PONV Risk Score and Plan: 2  Airway Management Planned:   Additional Equipment:   Intra-op Plan:   Post-operative Plan:   Informed Consent: I have reviewed the patients History and Physical, chart, labs and discussed the procedure including the risks, benefits and alternatives for the proposed anesthesia with the patient or authorized representative who has indicated his/her understanding and acceptance.     Plan Discussed with: Anesthesiologist  Anesthesia Plan Comments: (Plan for epidural for labor,  discussed epidural vs spinal vs GA if need for csection)        Lab Results  Component Value Date   WBC 13.9 (H) 02/01/2017   HGB 12.4 02/01/2017   HCT 35.2 02/01/2017   MCV 82.2 02/01/2017   PLT 246 02/01/2017    Anesthesia Quick Evaluation

## 2017-02-01 NOTE — Anesthesia Procedure Notes (Signed)
Epidural Patient location during procedure: OB Start time: 02/01/2017 1:40 AM End time: 02/01/2017 2:05 AM  Staffing Anesthesiologist: Alver FisherPENWARDEN, Kendle Erker Performed: anesthesiologist   Preanesthetic Checklist Completed: patient identified, site marked, surgical consent, pre-op evaluation, timeout performed, IV checked, risks and benefits discussed and monitors and equipment checked  Epidural Patient position: sitting Prep: ChloraPrep Patient monitoring: heart rate, continuous pulse ox and blood pressure Approach: midline Location: L3-L4 Injection technique: LOR saline  Needle:  Needle type: Tuohy  Needle gauge: 18 G Needle length: 9 cm and 9 Needle insertion depth: 6 cm Catheter type: closed end flexible Catheter size: 20 Guage Catheter at skin depth: 10 cm Test dose: negative  Assessment Events: blood not aspirated, injection not painful, no injection resistance, negative IV test and no paresthesia  Additional Notes CSE performed with needle through needle technique using 25g pencil tip spinal needle. 1mL 0.25% bupivacaine given for spinal dose.   Patient tolerated the insertion well without complications.Reason for block:procedure for pain

## 2017-02-01 NOTE — Anesthesia Postprocedure Evaluation (Signed)
Anesthesia Post Note  Patient: Carla Holt  Procedure(s) Performed: * No procedures listed *  Patient location during evaluation: Mother Baby Anesthesia Type: Epidural Level of consciousness: awake and alert Pain management: pain level controlled Vital Signs Assessment: post-procedure vital signs reviewed and stable Respiratory status: spontaneous breathing, nonlabored ventilation and respiratory function stable Cardiovascular status: stable Postop Assessment: no headache, no backache, epidural receding, no signs of nausea or vomiting and adequate PO intake Anesthetic complications: no     Last Vitals:  Vitals:   01/31/17 2311 02/01/17 0555  BP: 136/80 110/64  Pulse: 62 (!) 57  Resp: 18 18  Temp: 36.9 C (!) 36.4 C    Last Pain:  Vitals:   02/01/17 0622  TempSrc:   PainSc: 0-No pain                 Rica MastBachich,  Cheronda Erck M

## 2017-02-02 LAB — RPR: RPR Ser Ql: NONREACTIVE

## 2017-02-03 NOTE — Progress Notes (Signed)
Patient discharged home with infant. Discharge instructions, prescriptions and follow up appointment given to and reviewed with patient. Patient verbalized understanding. Patient wheeled out by RN with infant 

## 2017-02-03 NOTE — Discharge Summary (Signed)
Obstetric Discharge Summary- updated for following day discharge  Reason for Admission: onset of labor Prenatal Procedures: none Intrapartum Procedures: spontaneous vaginal delivery Postpartum Procedures: none, baby stayed for bili lights on 9/1 so mom did not discharge Complications-Operative and Postpartum: 2nd degree perineal laceration Hemoglobin  Date Value Ref Range Status  02/01/2017 11.6 (L) 12.0 - 16.0 g/dL Final  09/81/191406/21/2018 78.211.2 11.1 - 15.9 g/dL Final   HCT  Date Value Ref Range Status  02/01/2017 34.0 (L) 35.0 - 47.0 % Final   Hematocrit  Date Value Ref Range Status  11/22/2016 34.0 34.0 - 46.6 % Final    Physical Exam:  General: alert, cooperative and no distress Lochia: appropriate Uterine Fundus: firm DVT Evaluation: No evidence of DVT seen on physical exam.  Prenatal Labs: B+, Rubella Immune, Varicella Non-Immune (plans to get at her work), TDAP UTD  Discharge Diagnoses: Term Pregnancy-delivered  Discharge Information: Date: 02/03/2017 Activity: pelvic rest Diet: routine Allergies as of 02/03/2017   No Known Allergies     Medication List    TAKE these medications   PRENATAL VITAMIN PO Take by mouth.            Discharge Care Instructions        Start     Ordered   02/03/17 0000  Call MD for:  temperature >100.4    Comments:  Call MD for temperature >101.0   02/03/17 0904   02/03/17 0000  Call MD for:  persistant nausea and vomiting     02/03/17 0904   02/03/17 0000  Call MD for:  severe uncontrolled pain     02/03/17 0904   02/03/17 0000  Call MD for:  redness, tenderness, or signs of infection (pain, swelling, redness, odor or green/yellow discharge around incision site)     02/03/17 0904   02/03/17 0000  Call MD for:  difficulty breathing, headache or visual disturbances     02/03/17 0904   02/03/17 0000  Call MD for:  persistant dizziness or light-headedness     02/03/17 0904   02/03/17 0000  Activity as tolerated     02/03/17 0904   02/03/17 0000  Sexual acrtivity    Comments:  Please refrain from intercourse until evaluated by your doctor/midwife at your 6 week visit.   02/03/17 0904   02/03/17 0000  Diet general     02/03/17 0904   02/02/17 0000  Call MD for:  temperature >100.4    Comments:  Call MD for temperature >101.0   02/02/17 [id number]   02/02/17 0000  Call MD for:  persistant nausea and vomiting     02/02/17 0938   02/02/17 0000  Call MD for:  severe uncontrolled pain     02/02/17 0938   02/02/17 0000  Call MD for:  redness, tenderness, or signs of infection (pain, swelling, redness, odor or green/yellow discharge around incision site)     02/02/17 0938   02/02/17 0000  Call MD for:  difficulty breathing, headache or visual disturbances     02/02/17 0938   02/02/17 0000  Call MD for:  persistant dizziness or light-headedness     02/02/17 0938   02/02/17 0000  Activity as tolerated     02/02/17 0938   02/02/17 0000  Sexual acrtivity    Comments:  Please refrain from intercourse until evaluated by your doctor/midwife at your 6 week visit.   02/02/17 0938   02/02/17 0000  Diet general     02/02/17 [id number]  Contraception: undecided/progesterone only  Condition: stable  Discharge to: home Follow-up Information    Vena Austria, MD Follow up in 6 week(s).   Specialty:  Obstetrics and Gynecology Contact information: [address] [place] [address] [phone]           Newborn Data: Live born female Birth Weight: 5 lb 12.2 oz (2340 g) APGAR: 8, 9 Breastfeeding  Home with mother.  Tresea Mall, CNM 02/03/2017, 9:05 AM

## 2017-02-19 ENCOUNTER — Ambulatory Visit: Payer: 59

## 2017-03-26 ENCOUNTER — Ambulatory Visit (INDEPENDENT_AMBULATORY_CARE_PROVIDER_SITE_OTHER): Payer: 59 | Admitting: Obstetrics and Gynecology

## 2017-03-26 ENCOUNTER — Encounter: Payer: Self-pay | Admitting: Obstetrics and Gynecology

## 2017-03-26 MED ORDER — ESCITALOPRAM OXALATE 10 MG PO TABS: 10.0000 mg | ORAL_TABLET | Freq: Every day | ORAL | 2 refills | Status: DC

## 2017-03-26 NOTE — Progress Notes (Signed)
Postpartum Visit  Chief Complaint:  Chief Complaint  Patient presents with  . Postpartum Care    History of Present Illness: Patient is a 36 y.o. G2P2001 presents for postpartum visit.   Review the Delivery Report for details.  Date of delivery:  This patient has no babies on file. Type of delivery: Vaginal delivery - Vacuum or forceps assisted  no Episiotomy No.  Laceration: yes 2nd degree Pregnancy or labor problems:  no Any problems since the delivery:  no  Newborn Details:  SINGLETON :  1. BabyGender: Female Birth weight: 5lbs 15oz Maternal Details:  Breast Feeding:  yes Post partum depression/anxiety noted:  yes Edinburgh Post-Partum Depression Score:  4  Date of last PAP: 11/12/2014 normal   Review of Systems: Review of Systems  Constitutional: Negative for chills and fever.  HENT: Negative for congestion.   Respiratory: Negative for cough and shortness of breath.   Cardiovascular: Negative for chest pain and palpitations.  Gastrointestinal: Negative for abdominal pain, constipation, diarrhea, heartburn, nausea and vomiting.  Genitourinary: Negative for dysuria, frequency and urgency.  Skin: Negative for itching and rash.  Neurological: Negative for dizziness and headaches.  Endo/Heme/Allergies: Negative for polydipsia.  Psychiatric/Behavioral: Positive for depression. Negative for hallucinations and suicidal ideas. The patient is nervous/anxious.     The following portions of the patient's history were reviewed and updated as appropriate: allergies, current medications, past family history, past medical history, past social history, past surgical history and problem list.  Past Medical History:  Past Medical History:  Diagnosis Date  . Laceration, obstetrical, second degree    deep    Past Surgical History:  History reviewed. No pertinent surgical history.  Family History:  Family History  Problem Relation Age of Onset  . Bladder Cancer Neg Hx   .  Kidney cancer Neg Hx     Social History:  Social History   Social History  . Marital status: Married    Spouse name: Carla Holt  . Number of children: Carla Holt  . Years of education: Carla Holt   Occupational History  . Not on file.   Social History Main Topics  . Smoking status: Never Smoker  . Smokeless tobacco: Never Used  . Alcohol use No  . Drug use: No  . Sexual activity: Yes    Birth control/ protection: Carla Holt   Other Topics Concern  . Not on file   Social History Narrative  . No narrative on file    Allergies:  No Known Allergies  Medications: Prior to Admission medications   Medication Sig Start Date End Date Taking? Authorizing Provider  Prenatal Vit-Fe Fumarate-FA (PRENATAL VITAMIN PO) Take by mouth.   Yes [provider]  escitalopram (LEXAPRO) 10 MG tablet Take 1 tablet (10 mg total) by mouth daily. 03/26/17 03/26/18  Vena Austria, MD    Physical Exam Vitals:  Vitals:   03/26/17 1000  BP: 108/68  Pulse: 83    General: NAD HEENT: normocephalic, anicteric Pulmonary: No increased work of breathing Genitourinary:  External: Normal external female genitalia.  Normal urethral meatus, normal  Bartholin's and Skene's glands.    Vagina: Normal vaginal mucosa, no evidence of prolapse.    Cervix: Grossly normal in appearance, no bleeding  Uterus: Non-enlarged, mobile, normal contour.  No CMT  Adnexa: ovaries non-enlarged, no adnexal masses  Rectal: deferred Extremities: no edema, erythema, or tenderness Neurologic: Grossly intact Psychiatric: mood appropriate, affect full  Assessment: 36 y.o. G2P2001 presenting for 6 week postpartum visit  Plan:  Problem List Items Addressed This Visit    Carla Holt    Visit Diagnoses    Encounter for postpartum visit    -  Primary       1) Contraception Education given regarding options for contraception, including nexplanon and Mirena, handouts given.  Both pregnancies secondary to IVF.  2)  Pap - ASCCP guidelines  and rational discussed.  Patient opts for 3 year screening interval  3) Patient underwent screening for postpartum depression with concerns noted.  Good EPDS score but feels like she has had a harder time following this pregnancy and some increased bouts of crying.  Some situational as her husbands Aunt died of ovarian cancer and her husband developed A-Fib soon after his wife's passing. - start lexparo 10mg   4) Follow up 1 year for routine annual exam

## 2017-06-02 IMAGING — CT CT RENAL STONE PROTOCOL
1 of 2 series · 16 of 32 positions shown, 20 images · non-contrast
Comparison: None.

CLINICAL DATA: History of kidney stones.  Asymptomatic.

EXAM:
CT ABDOMEN AND PELVIS WITHOUT CONTRAST
TECHNIQUE: Multidetector CT imaging of the abdomen and pelvis was performed
following the standard protocol without IV contrast.

[Series 2: axial st · axial · 0.63mm/px · z∈[-966,-566]mm · 16 of 88 slices shown, 20 images]
[im 4/88  soft-tissue]
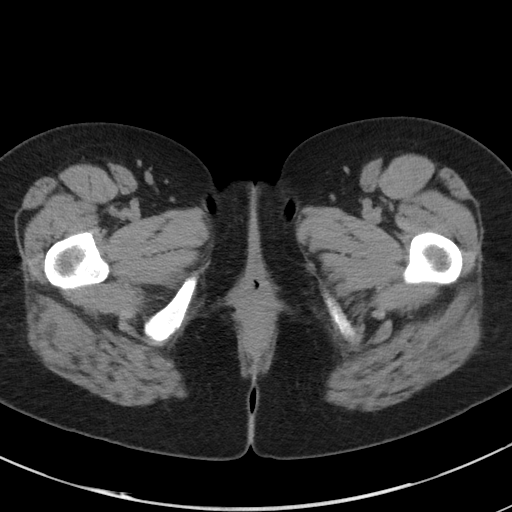
[im 4/88  bone]
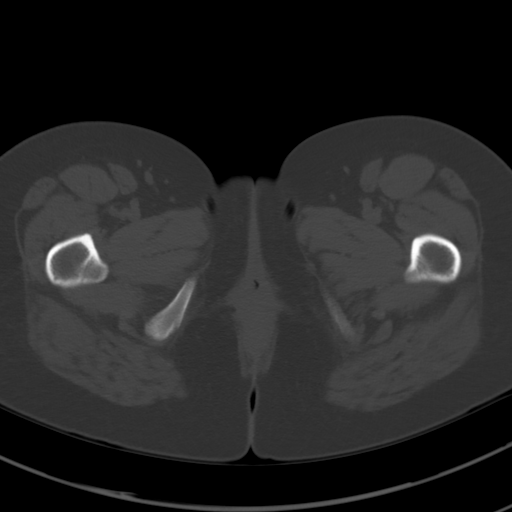
[im 11/88  soft-tissue]
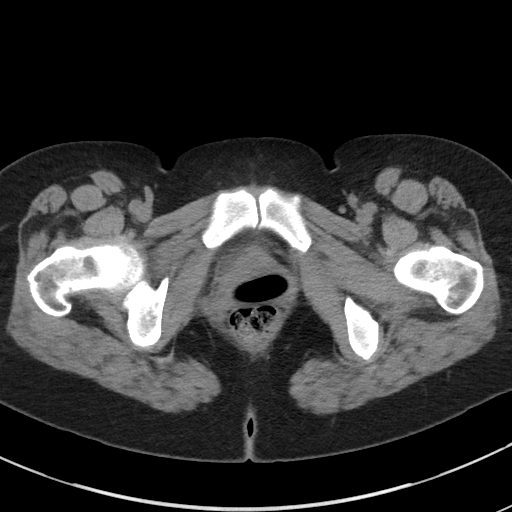
[im 17/88  soft-tissue]
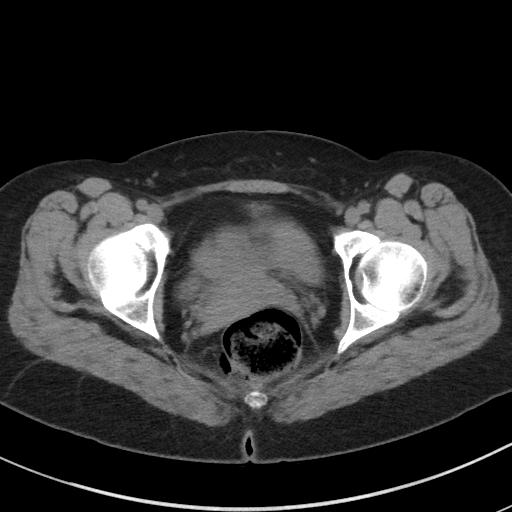
[im 24/88  soft-tissue]
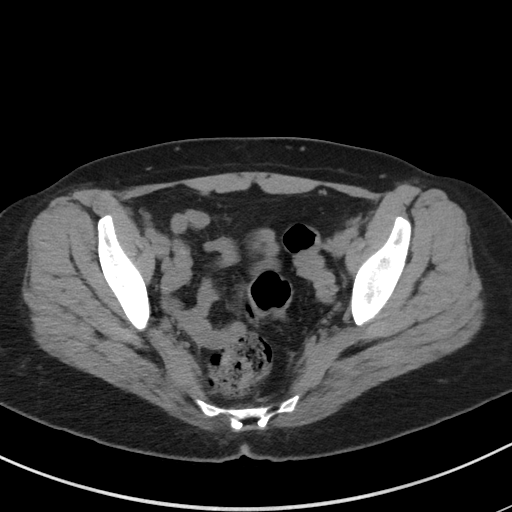
[im 31/88  soft-tissue]
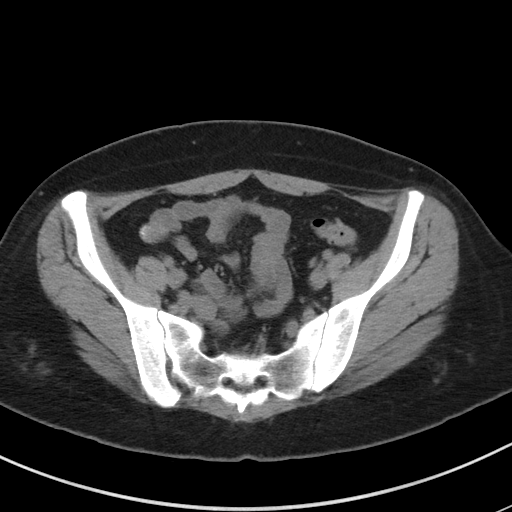
[im 34/88  soft-tissue]
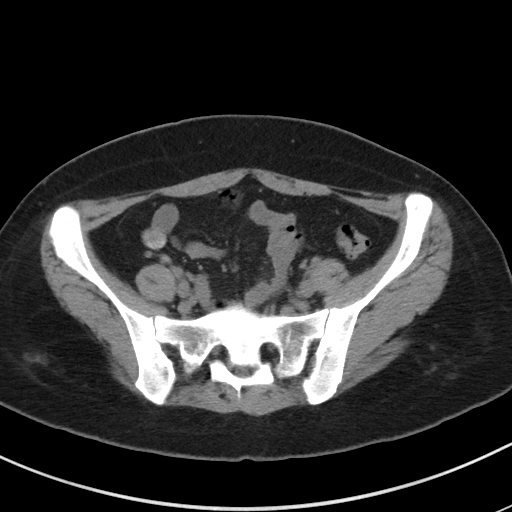
[im 41/88  soft-tissue]
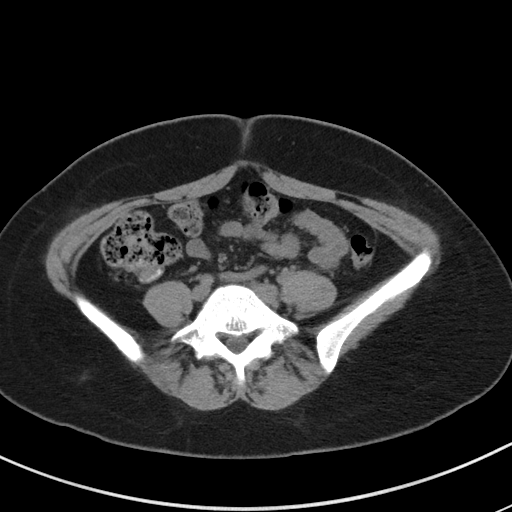
[im 47/88  soft-tissue]
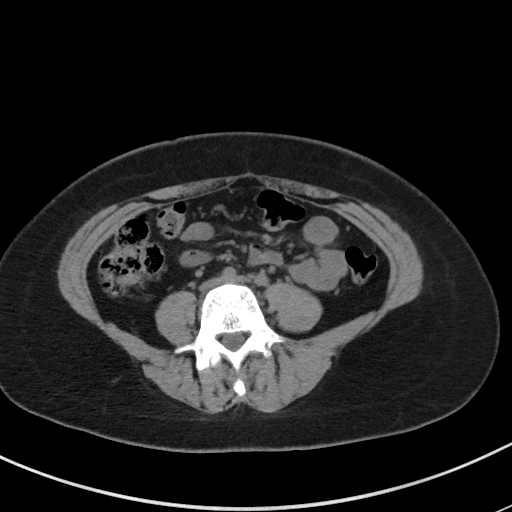
[im 54/88  soft-tissue]
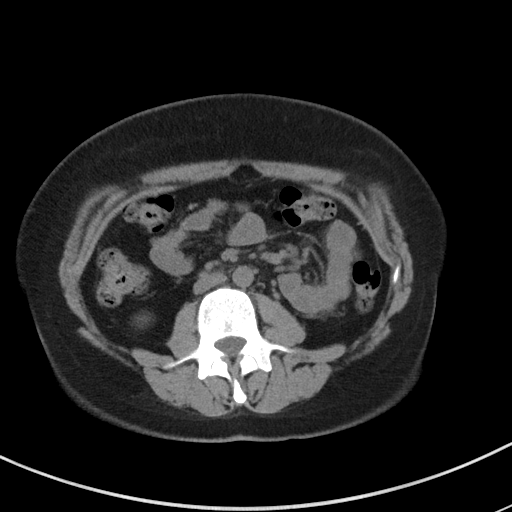
[im 54/88  bone]
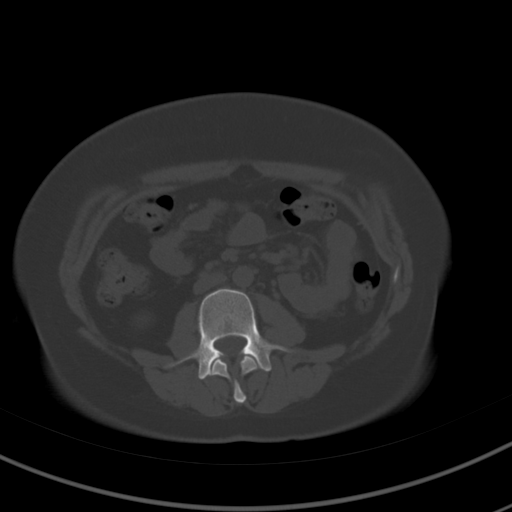
[im 57/88  soft-tissue]
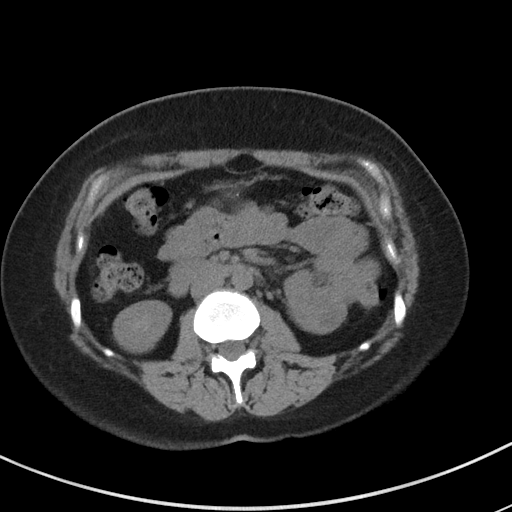
[im 64/88  soft-tissue]
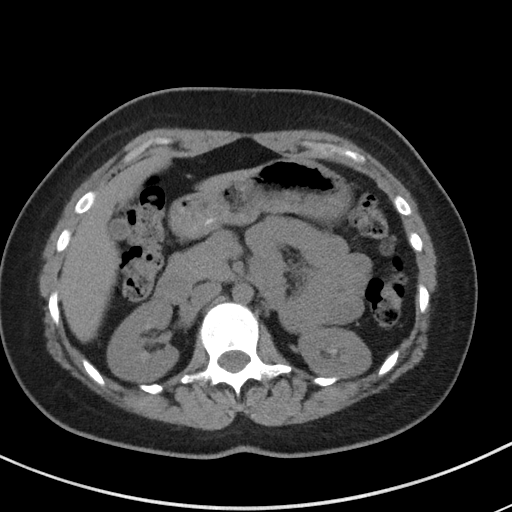
[im 71/88  soft-tissue]
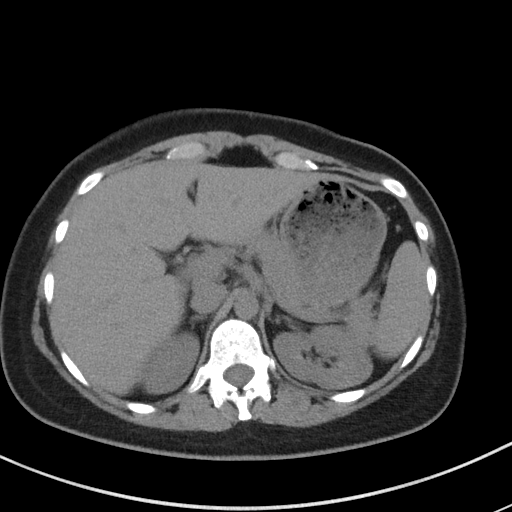
[im 74/88  lung]
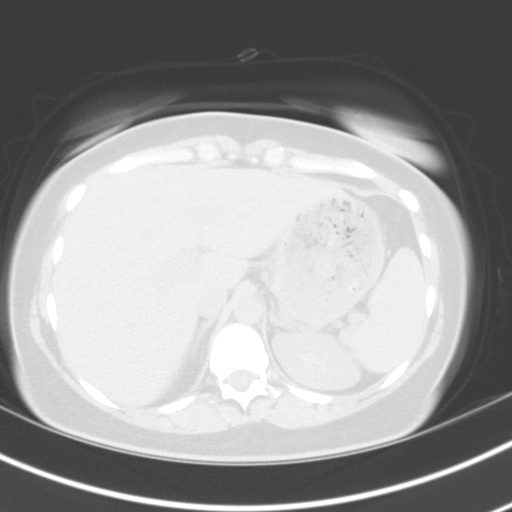
[im 77/88  soft-tissue]
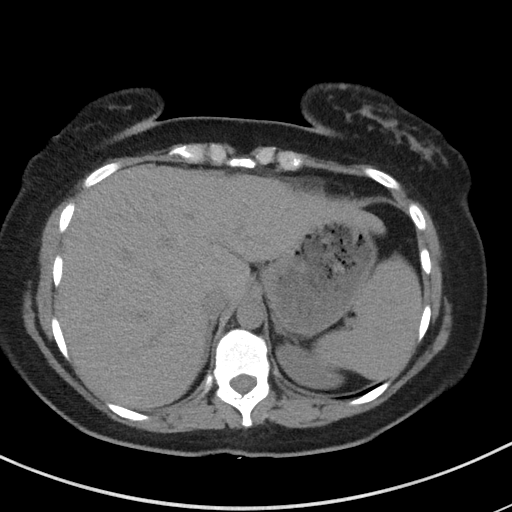
[im 77/88  lung]
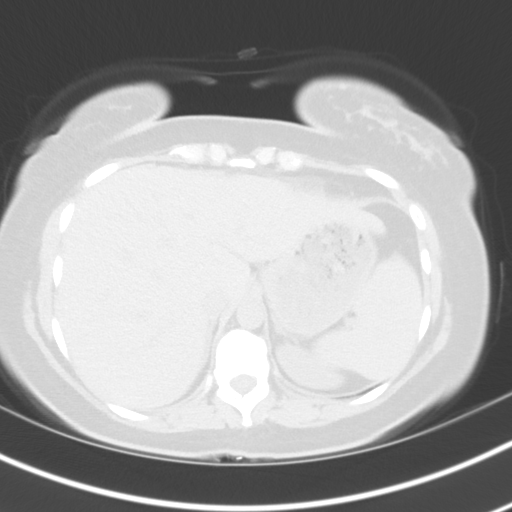
[im 81/88  lung]
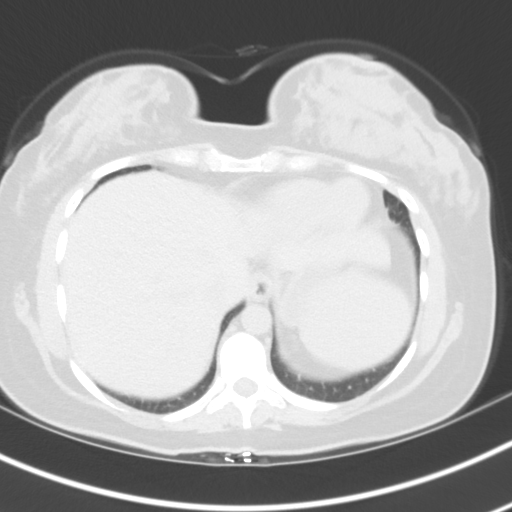
[im 84/88  soft-tissue]
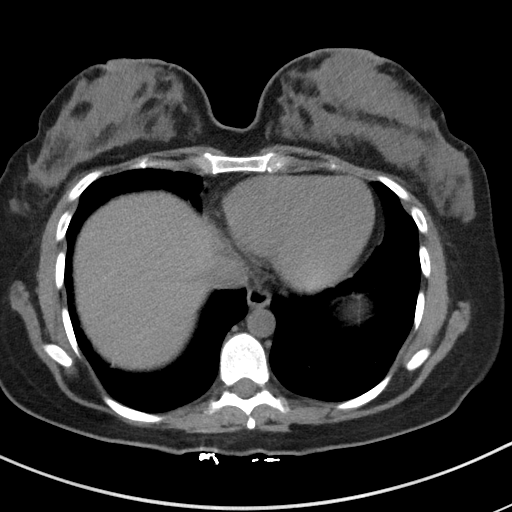
[im 84/88  lung]
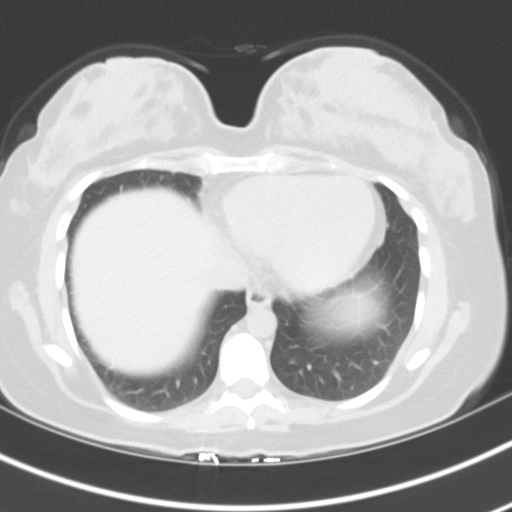

[16 of 32 positions shown; findings below may reference images not displayed]

FINDINGS: Lower chest: No lung nodule

Hepatobiliary: Liver is unremarkable in appearance. Gallbladder is
decompressed.

Pancreas: Unremarkable

Spleen: Unremarkable

Adrenals/Urinary Tract: There is no hydronephrosis. There is no
urinary calculus.

Stomach/Bowel: Normal appendix. No evidence of small-bowel
obstruction. No obvious mass in the colon.

Vascular/Lymphatic: No evidence of aortic aneurysm. No obvious
abnormal adenopathy.

Reproductive: Uterus, adnexa, and bladder are unremarkable.

Other: No free-fluid.

Musculoskeletal: No vertebral compression deformity.
IMPRESSION: No evidence of urinary obstruction or urinary calculus.

## 2018-01-08 ENCOUNTER — Ambulatory Visit: Payer: 59 | Admitting: Obstetrics and Gynecology

## 2018-01-15 ENCOUNTER — Ambulatory Visit: Payer: 59 | Admitting: Obstetrics and Gynecology

## 2018-02-28 ENCOUNTER — Telehealth: Payer: Self-pay | Admitting: Obstetrics and Gynecology

## 2018-02-28 NOTE — Telephone Encounter (Signed)
-----   Message from Vena Austria, MD sent at 02/28/2018  8:08 AM EDT ----- Regarding: Annual 9/30 Annual on 9/30 at 8:10 with myself (just need on room on any hall)

## 2018-02-28 NOTE — Telephone Encounter (Signed)
Unable to schedule due to day off. Had [facility] add patient to schedule day.

## 2018-03-03 ENCOUNTER — Encounter: Payer: Self-pay | Admitting: Obstetrics and Gynecology

## 2018-03-03 ENCOUNTER — Ambulatory Visit (INDEPENDENT_AMBULATORY_CARE_PROVIDER_SITE_OTHER): Payer: 59 | Admitting: Obstetrics and Gynecology

## 2018-03-03 ENCOUNTER — Other Ambulatory Visit (HOSPITAL_COMMUNITY)
Admission: RE | Admit: 2018-03-03 | Discharge: 2018-03-03 | Disposition: A | Discharge status: Home / Self Care | Payer: 59 | Source: Ambulatory Visit | Attending: Obstetrics and Gynecology | Admitting: Obstetrics and Gynecology

## 2018-03-03 VITALS — BP 108/62 | HR 65 | Ht 63.0 in | Wt 140.0 lb

## 2018-03-03 DIAGNOSIS — Z01419 Encounter for gynecological examination (general) (routine) without abnormal findings: Secondary | ICD-10-CM

## 2018-03-03 DIAGNOSIS — Z124 Encounter for screening for malignant neoplasm of cervix: Secondary | ICD-10-CM | POA: Insufficient documentation

## 2018-03-03 DIAGNOSIS — Z1231 Encounter for screening mammogram for malignant neoplasm of breast: Secondary | ICD-10-CM

## 2018-03-03 DIAGNOSIS — Z1239 Encounter for other screening for malignant neoplasm of breast: Secondary | ICD-10-CM

## 2018-03-03 MED ORDER — NORGESTIMATE-ETH ESTRADIOL 0.25-35 MG-MCG PO TABS: 1.0000 | ORAL_TABLET | Freq: Every day | ORAL | 3 refills | Status: DC

## 2018-03-03 NOTE — Patient Instructions (Signed)
Preventive Care 18-39 Years, Female Preventive care refers to lifestyle choices and visits with your health care provider that can promote health and wellness. What does preventive care include?  A yearly physical exam. This is also called an annual well check.  Dental exams once or twice a year.  Routine eye exams. Ask your health care provider how often you should have your eyes checked.  Personal lifestyle choices, including: ? Daily care of your teeth and gums. ? Regular physical activity. ? Eating a healthy diet. ? Avoiding tobacco and drug use. ? Limiting alcohol use. ? Practicing safe sex. ? Taking vitamin and mineral supplements as recommended by your health care provider. What happens during an annual well check? The services and screenings done by your health care provider during your annual well check will depend on your age, overall health, lifestyle risk factors, and family history of disease. Counseling Your health care provider may ask you questions about your:  Alcohol use.  Tobacco use.  Drug use.  Emotional well-being.  Home and relationship well-being.  Sexual activity.  Eating habits.  Work and work Statistician.  Method of birth control.  Menstrual cycle.  Pregnancy history.  Screening You may have the following tests or measurements:  Height, weight, and BMI.  Diabetes screening. This is done by checking your blood sugar (glucose) after you have not eaten for a while (fasting).  Blood pressure.  Lipid and cholesterol levels. These may be checked every 5 years starting at age 66.  Skin check.  Hepatitis C blood test.  Hepatitis B blood test.  Sexually transmitted disease (STD) testing.  BRCA-related cancer screening. This may be done if you have a family history of breast, ovarian, tubal, or peritoneal cancers.  Pelvic exam and Pap test. This may be done every 3 years starting at age 40. Starting at age 59, this may be done every 5  years if you have a Pap test in combination with an HPV test.  Discuss your test results, treatment options, and if necessary, the need for more tests with your health care provider. Vaccines Your health care provider may recommend certain vaccines, such as:  Influenza vaccine. This is recommended every year.  Tetanus, diphtheria, and acellular pertussis (Tdap, Td) vaccine. You may need a Td booster every 10 years.  Varicella vaccine. You may need this if you have not been vaccinated.  HPV vaccine. If you are 69 or younger, you may need three doses over 6 months.  Measles, mumps, and rubella (MMR) vaccine. You may need at least one dose of MMR. You may also need a second dose.  Pneumococcal 13-valent conjugate (PCV13) vaccine. You may need this if you have certain conditions and were not previously vaccinated.  Pneumococcal polysaccharide (PPSV23) vaccine. You may need one or two doses if you smoke cigarettes or if you have certain conditions.  Meningococcal vaccine. One dose is recommended if you are age 27-21 years and a first-year college student living in a residence hall, or if you have one of several medical conditions. You may also need additional booster doses.  Hepatitis A vaccine. You may need this if you have certain conditions or if you travel or work in places where you may be exposed to hepatitis A.  Hepatitis B vaccine. You may need this if you have certain conditions or if you travel or work in places where you may be exposed to hepatitis B.  Haemophilus influenzae type b (Hib) vaccine. You may need this if  you have certain risk factors.  Talk to your health care provider about which screenings and vaccines you need and how often you need them. This information is not intended to replace advice given to you by your health care provider. Make sure you discuss any questions you have with your health care provider. Document Released: 07/17/2001 Document Revised: 02/08/2016  Document Reviewed: 03/22/2015 Elsevier Interactive Patient Education  [employer].

## 2018-03-03 NOTE — Progress Notes (Signed)
Gynecology Annual Exam   PCP: Patient, No Pcp Per  Chief Complaint:  Chief Complaint  Patient presents with  . Gynecologic Exam    History of Present Illness: Patient is a 37 y.o. G2P2001 presents for annual exam. The patient has no complaints today.   LMP: Patient's last menstrual period was 02/02/2018 (exact date). Average Interval: regular, 28 days Duration of flow: 5 days Heavy Menses: no Clots: no Intermenstrual Bleeding: no Postcoital Bleeding: no Dysmenorrhea: no  The patient is sexually active. She currently uses none for contraception. She denies dyspareunia.  The patient does perform self breast exams.  There is no notable family history of breast or ovarian cancer in her family.  The patient wears seatbelts: yes.   The patient has regular exercise: not asked.    The patient denies current symptoms of depression.    Review of Systems: ROS  Past Medical History:  Past Medical History:  Diagnosis Date  . Laceration, obstetrical, second degree    deep    Past Surgical History:  History reviewed. No pertinent surgical history.  Gynecologic History:  Patient's last menstrual period was 02/02/2018 (exact date). Contraception: none Last Pap: Results were: 11/12/2014 NIL and HR HPV negative   Obstetric History: G2P2001  Family History:  Family History  Problem Relation Age of Onset  . Bladder Cancer Neg Hx   . Kidney cancer Neg Hx     Social History:  Social History   Socioeconomic History  . Marital status: Married    Spouse name: Not on file  . Number of children: Not on file  . Years of education: Not on file  . Highest education level: Not on file  Occupational History  . Not on file  Social Needs  . Financial resource strain: Not on file  . Food insecurity:    Worry: Not on file    Inability: Not on file  . Transportation needs:    Medical: Not on file    Non-medical: Not on file  Tobacco Use  . Smoking status: Never Smoker  .  Smokeless tobacco: Never Used  Substance and Sexual Activity  . Alcohol use: No  . Drug use: No  . Sexual activity: Yes    Birth control/protection: None  Lifestyle  . Physical activity:    Days per week: Not on file    Minutes per session: Not on file  . Stress: Not on file  Relationships  . Social connections:    Talks on phone: Not on file    Gets together: Not on file    Attends religious service: Not on file    Active member of club or organization: Not on file    Attends meetings of clubs or organizations: Not on file    Relationship status: Not on file  . Intimate partner violence:    Fear of current or ex partner: Not on file    Emotionally abused: Not on file    Physically abused: Not on file    Forced sexual activity: Not on file  Other Topics Concern  . Not on file  Social History Narrative  . Not on file    Allergies:  No Known Allergies  Medications: Prior to Admission medications   Medication Sig Start Date End Date Taking? Authorizing Provider  escitalopram (LEXAPRO) 10 MG tablet Take 1 tablet (10 mg total) by mouth daily. 03/26/17 03/26/18  Vena Austria, MD  Prenatal Vit-Fe Fumarate-FA (PRENATAL VITAMIN PO) Take by mouth.  [provider]    Physical Exam Vitals: Blood pressure 108/62, pulse 65, height 5\' 3"  (1.6 m), weight 140 lb (63.5 kg), last menstrual period 02/02/2018, currently breastfeeding.   General: NAD HEENT: normocephalic, anicteric Thyroid: no enlargement, no palpable nodules Pulmonary: No increased work of breathing, CTAB Cardiovascular: RRR, distal pulses 2+ Breast: Breast symmetrical, no tenderness, no palpable nodules or masses, no skin or nipple retraction present, no nipple discharge.  No axillary or supraclavicular lymphadenopathy. Abdomen: NABS, soft, non-tender, non-distended.  Umbilicus without lesions.  No hepatomegaly, splenomegaly or masses palpable. No evidence of hernia  Genitourinary:  External: Normal  external female genitalia.  Normal urethral meatus, normal Bartholin's and Skene's glands.    Vagina: Normal vaginal mucosa, no evidence of prolapse.    Cervix: Grossly normal in appearance, no bleeding  Uterus: Non-enlarged, mobile, normal contour.  No CMT  Adnexa: ovaries non-enlarged, no adnexal masses  Rectal: deferred  Lymphatic: no evidence of inguinal lymphadenopathy Extremities: no edema, erythema, or tenderness Neurologic: Grossly intact Psychiatric: mood appropriate, affect full  Female chaperone present for pelvic and breast  portions of the physical exam    Assessment: 37 y.o. G2P2001 routine annual exam  Plan: Problem List Items Addressed This Visit    None    Visit Diagnoses    Encounter for gynecological examination without abnormal finding    -  Primary   Breast screening       Screening for malignant neoplasm of cervix       Relevant Orders   Cytology - PAP      2) STI screening  was notoffered and therefore not obtained  2)  ASCCP guidelines and rational discussed.  Patient opts for every 3 years screening interval  3) Contraception - the patient is currently using  none.  She is happy with her current form of contraception and plans to continue - restart OCP, two embryo's left  4) Routine healthcare maintenance including cholesterol, diabetes screening not indicated  5) Return in about 1 year (around 03/04/2019) for annual.   Vena Austria, MD, [facility], [facility] 03/03/2018, 8:39 AM

## 2018-03-05 LAB — CYTOLOGY - PAP
DIAGNOSIS: NEGATIVE
HPV: NOT DETECTED

## 2018-12-04 DIAGNOSIS — E289 Ovarian dysfunction, unspecified: Secondary | ICD-10-CM | POA: Diagnosis not present

## 2019-01-01 ENCOUNTER — Other Ambulatory Visit: Payer: Self-pay | Admitting: Obstetrics and Gynecology

## 2019-01-01 MED ORDER — NORETHIN ACE-ETH ESTRAD-FE 1-20 MG-MCG PO TABS: 1.0000 | ORAL_TABLET | Freq: Every day | ORAL | 11 refills | Status: DC

## 2019-05-15 ENCOUNTER — Ambulatory Visit: Payer: 59 | Admitting: Internal Medicine

## 2019-06-20 DIAGNOSIS — Z20822 Contact with and (suspected) exposure to covid-19: Secondary | ICD-10-CM | POA: Diagnosis not present

## 2019-07-24 DIAGNOSIS — N979 Female infertility, unspecified: Secondary | ICD-10-CM | POA: Diagnosis not present

## 2019-08-18 ENCOUNTER — Other Ambulatory Visit: Payer: Self-pay

## 2019-08-18 ENCOUNTER — Ambulatory Visit (INDEPENDENT_AMBULATORY_CARE_PROVIDER_SITE_OTHER): Payer: 59 | Admitting: Obstetrics and Gynecology

## 2019-08-18 ENCOUNTER — Encounter: Payer: Self-pay | Admitting: Obstetrics and Gynecology

## 2019-08-18 VITALS — BP 112/70 | Wt 143.0 lb

## 2019-08-18 DIAGNOSIS — Z01419 Encounter for gynecological examination (general) (routine) without abnormal findings: Secondary | ICD-10-CM

## 2019-08-18 DIAGNOSIS — Z3041 Encounter for surveillance of contraceptive pills: Secondary | ICD-10-CM

## 2019-08-18 DIAGNOSIS — Z1239 Encounter for other screening for malignant neoplasm of breast: Secondary | ICD-10-CM

## 2019-08-18 MED ORDER — NORETHIN ACE-ETH ESTRAD-FE 1-20 MG-MCG PO TABS: 1.0000 | ORAL_TABLET | Freq: Every day | ORAL | 3 refills | Status: DC

## 2019-08-18 NOTE — Progress Notes (Signed)
Gynecology Annual Exam   PCP: Carla Holt, No Pcp Per  Chief Complaint:  Chief Complaint  Carla Holt presents with  . Gynecologic Exam    History of Present Illness: Carla Holt is a 39 y.o. G2P2001 presents for annual exam. The Carla Holt has no complaints today.   LMP: Carla Holt's last menstrual period was 08/10/2019 (exact date). Average Interval: regular, 28 days Duration of flow: 5 days Heavy Menses: no Clots: no Intermenstrual Bleeding: yes (does have breakthrough on week 3 of pill pack) Postcoital Bleeding: no Dysmenorrhea: no  The Carla Holt is sexually active. She currently uses OCP (estrogen/progesterone) for contraception. She denies dyspareunia.  The Carla Holt does perform self breast exams.  There is no notable family history of breast or ovarian cancer in her family.  The Carla Holt wears seatbelts: yes.   The Carla Holt has regular exercise: not asked.    The Carla Holt denies current symptoms of depression.    Review of Systems: Review of Systems  Constitutional: Negative for chills and fever.  HENT: Negative for congestion.   Respiratory: Negative for cough and shortness of breath.   Cardiovascular: Negative for chest pain and palpitations.  Gastrointestinal: Negative for abdominal pain, constipation, diarrhea, heartburn, nausea and vomiting.  Genitourinary: Negative for dysuria, frequency and urgency.  Skin: Negative for itching and rash.  Neurological: Negative for dizziness and headaches.  Endo/Heme/Allergies: Negative for polydipsia.  Psychiatric/Behavioral: Negative for depression.    Past Medical History:  Carla Holt Active Problem List   Diagnosis Date Noted  . Kidney stones 12/26/2015    Past Surgical History:  History reviewed. No pertinent surgical history.  Gynecologic History:  Carla Holt's last menstrual period was 08/10/2019 (exact date). Contraception: OCP (estrogen/progesterone) Last Pap: Results were: 03/03/2018 NIL and HR HPV negative   Obstetric History:  G2P2001  Family History:  Family History  Problem Relation Age of Onset  . Bladder Cancer Neg Hx   . Kidney cancer Neg Hx     Social History:  Social History   Socioeconomic History  . Marital status: Married    Spouse name: Not on file  . Number of children: Not on file  . Years of education: Not on file  . Highest education level: Not on file  Occupational History  . Not on file  Tobacco Use  . Smoking status: Never Smoker  . Smokeless tobacco: Never Used  Substance and Sexual Activity  . Alcohol use: No  . Drug use: No  . Sexual activity: Yes    Birth control/protection: None  Other Topics Concern  . Not on file  Social History Narrative  . Not on file   Social Determinants of Health   Financial Resource Strain:   . Difficulty of Paying Living Expenses:   Food Insecurity:   . Worried About Programme researcher, broadcasting/film/video in the Last Year:   . Barista in the Last Year:   Transportation Needs:   . Freight forwarder (Medical):   Marland Kitchen Lack of Transportation (Non-Medical):   Physical Activity:   . Days of Exercise per Week:   . Minutes of Exercise per Session:   Stress:   . Feeling of Stress :   Social Connections:   . Frequency of Communication with Friends and Family:   . Frequency of Social Gatherings with Friends and Family:   . Attends Religious Services:   . Active Member of Clubs or Organizations:   . Attends Banker Meetings:   Marland Kitchen Marital Status:   Intimate Programme researcher, broadcasting/film/video  Violence:   . Fear of Current or Ex-Partner:   . Emotionally Abused:   Marland Kitchen Physically Abused:   . Sexually Abused:     Allergies:  No Known Allergies  Medications: Prior to Admission medications   Medication Sig Start Date End Date Taking? Authorizing Provider  norethindrone-ethinyl estradiol (JUNEL FE 1/20) 1-20 MG-MCG tablet Take 1 tablet by mouth daily. 01/01/19  Yes Malachy Mood, MD    Physical Exam Vitals: Blood pressure 112/70, weight 143 lb (64.9 kg), last  menstrual period 08/10/2019, currently breastfeeding.  General: NAD, well nourished, appears stated age 74: normocephalic, anicteric Pulmonary: No increased work of breathing, CTAB Cardiovascular: RRR, distal pulses 2+ Breast: Breast symmetrical, no tenderness, no palpable nodules or masses, no skin or nipple retraction present, no nipple discharge.  No axillary or supraclavicular lymphadenopathy. Abdomen: NABS, soft, non-tender, non-distended.  Umbilicus without lesions.  No hepatomegaly, splenomegaly or masses palpable. No evidence of hernia  Genitourinary:  External: Normal external female genitalia.  Normal urethral meatus, normal Bartholin's and Skene's glands.    Vagina: Normal vaginal mucosa, no evidence of prolapse.    Cervix: Grossly normal in appearance, no bleeding  Uterus: Non-enlarged, mobile, normal contour.  No CMT  Adnexa: ovaries non-enlarged, no adnexal masses  Rectal: deferred  Lymphatic: no evidence of inguinal lymphadenopathy Extremities: no edema, erythema, or tenderness Neurologic: Grossly intact Psychiatric: mood appropriate, affect full  Female chaperone present for pelvic and breast  portions of the physical exam    Assessment: 39 y.o. G2P2001 routine annual exam  Plan: Problem List Items Addressed This Visit    None    Visit Diagnoses    Encounter for gynecological examination without abnormal finding    -  Primary   Breast screening       Surveillance for birth control, oral contraceptives          1) STI screening  was notoffered and therefore not obtained  2)  ASCCP guidelines and rational discussed.  Carla Holt opts for every 3 years screening interval  3) Contraception - the Carla Holt is currently using  OCP (estrogen/progesterone).  She is happy with her current form of contraception and plans to continue - consider embryo transfer sometime this summer.  Discussed AMA risk really don't apply given we use age at time of oocyte retrieval to gauge  trisomy risk.  She also has no other co-morbidities that would make her high risk.  In the setting of possible twin pregnancy this would have some delivery implications.  We also discussed pros and cons of tubal ligation vs vasectomy for long term contraception  4) Routine healthcare maintenance including cholesterol, diabetes screening discussed managed by PCP  5) Return in about 1 year (around 08/17/2020) for annual.   Malachy Mood, MD, [facility], [place]

## 2019-09-08 DIAGNOSIS — N979 Female infertility, unspecified: Secondary | ICD-10-CM | POA: Diagnosis not present

## 2019-09-17 DIAGNOSIS — N979 Female infertility, unspecified: Secondary | ICD-10-CM | POA: Diagnosis not present

## 2019-09-25 DIAGNOSIS — N979 Female infertility, unspecified: Secondary | ICD-10-CM | POA: Diagnosis not present

## 2019-10-02 DIAGNOSIS — N979 Female infertility, unspecified: Secondary | ICD-10-CM | POA: Diagnosis not present

## 2019-10-12 DIAGNOSIS — Z3200 Encounter for pregnancy test, result unknown: Secondary | ICD-10-CM | POA: Diagnosis not present

## 2019-10-15 DIAGNOSIS — N979 Female infertility, unspecified: Secondary | ICD-10-CM | POA: Diagnosis not present

## 2019-10-15 DIAGNOSIS — Z3201 Encounter for pregnancy test, result positive: Secondary | ICD-10-CM | POA: Diagnosis not present

## 2019-10-20 ENCOUNTER — Telehealth: Payer: Self-pay | Admitting: Obstetrics and Gynecology

## 2019-10-20 DIAGNOSIS — Z3201 Encounter for pregnancy test, result positive: Secondary | ICD-10-CM | POA: Diagnosis not present

## 2019-10-20 DIAGNOSIS — N979 Female infertility, unspecified: Secondary | ICD-10-CM | POA: Diagnosis not present

## 2019-10-20 NOTE — Telephone Encounter (Signed)
Patient is scheduled for 11/04/19 8:30 in [place] with AMS

## 2019-10-20 NOTE — Telephone Encounter (Signed)
-----   Message from Vena Austria, MD sent at 10/20/2019  1:38 PM EDT ----- Regarding: NOB visit Needs NOB in the next 1-2 weeks with me   Vena Austria, MD, [facility], [facility] 10/20/2019, 1:39 PM

## 2019-10-27 ENCOUNTER — Other Ambulatory Visit: Payer: Self-pay | Admitting: Obstetrics and Gynecology

## 2019-10-27 ENCOUNTER — Telehealth: Payer: Self-pay | Admitting: Obstetrics and Gynecology

## 2019-10-27 DIAGNOSIS — O3680X0 Pregnancy with inconclusive fetal viability, not applicable or unspecified: Secondary | ICD-10-CM

## 2019-10-27 NOTE — Telephone Encounter (Signed)
Patient is schedule for 11/03/19

## 2019-10-27 NOTE — Telephone Encounter (Signed)
Left message for patient to call office back to schedule U/S for viability/dating scan before her NOB appt in [place].

## 2019-11-03 ENCOUNTER — Other Ambulatory Visit: Payer: 59

## 2019-11-03 ENCOUNTER — Other Ambulatory Visit: Payer: Self-pay

## 2019-11-03 ENCOUNTER — Ambulatory Visit (INDEPENDENT_AMBULATORY_CARE_PROVIDER_SITE_OTHER): Payer: 59

## 2019-11-03 DIAGNOSIS — O3680X0 Pregnancy with inconclusive fetal viability, not applicable or unspecified: Secondary | ICD-10-CM | POA: Diagnosis not present

## 2019-11-03 NOTE — Progress Notes (Signed)
Obstetric Problem Visit    Chief Complaint:  Chief Complaint  Patient presents with  . NOB    History of Present Illness: Patient is a 39 y.o. [id number] Unknown presenting for first trimester bleeding.  The onset of bleeding was over a week ago. Patient with no evidence of IUP despite certain dates 5 day embryo transfer (2 embryos)  Is bleeding equal to or greater than normal menstrual flow:  No Any recent trauma:  No History of prior miscarriage:  No Prior ultrasound demonstrating IUP: none.  Prior ultrasound demonstrating viable IUP:  No Prior Serum HCG:  No Rh status: B POS   Review of Systems: Review of Systems  Constitutional: Negative.   Gastrointestinal: Negative.   Genitourinary: Negative.     Past Medical History:  Patient Active Problem List   Diagnosis Date Noted  . Kidney stones 12/26/2015    Past Surgical History:  No past surgical history on file.  Obstetric History: G29P2001  Family History:  Family History  Problem Relation Age of Onset  . Bladder Cancer Neg Hx   . Kidney cancer Neg Hx     Social History:  Social History   Socioeconomic History  . Marital status: Married    Spouse name: Not on file  . Number of children: Not on file  . Years of education: Not on file  . Highest education level: Not on file  Occupational History  . Not on file  Tobacco Use  . Smoking status: Never Smoker  . Smokeless tobacco: Never Used  Substance and Sexual Activity  . Alcohol use: No  . Drug use: No  . Sexual activity: Yes    Birth control/protection: None  Other Topics Concern  . Not on file  Social History Narrative  . Not on file   Social Determinants of Health   Financial Resource Strain:   . Difficulty of Paying Living Expenses:   Food Insecurity:   . Worried About Programme researcher, broadcasting/film/video in the Last Year:   . Barista in the Last Year:   Transportation Needs:   . Freight forwarder (Medical):   Marland Kitchen Lack of Transportation  (Non-Medical):   Physical Activity:   . Days of Exercise per Week:   . Minutes of Exercise per Session:   Stress:   . Feeling of Stress :   Social Connections:   . Frequency of Communication with Friends and Family:   . Frequency of Social Gatherings with Friends and Family:   . Attends Religious Services:   . Active Member of Clubs or Organizations:   . Attends Banker Meetings:   Marland Kitchen Marital Status:   Intimate Partner Violence:   . Fear of Current or Ex-Partner:   . Emotionally Abused:   Marland Kitchen Physically Abused:   . Sexually Abused:     Allergies:  No Known Allergies  Medications: Prior to Admission medications   Medication Sig Start Date End Date Taking? Authorizing Provider  norethindrone-ethinyl estradiol (JUNEL FE 1/20) 1-20 MG-MCG tablet Take 1 tablet by mouth daily. 08/18/19   Vena Austria, MD    Physical Exam Vitals: Last menstrual period 10/02/2019, currently breastfeeding.  General: NAD HEENT: normocephalic, anicteric Pulmonary: No increased work of breathing, Genitourinary:  External: Normal external female genitalia.  Normal urethral meatus, normal Bartholin's and Skene's glands.    Vagina: Normal vaginal mucosa, no evidence of prolapse.    Cervix: closed on ultrasound, slight bleeding noted on probe cover  Uterus: non-enlarged  Adnexa: ovaries non-enlarged, no adnexal masses  Rectal: deferred Extremities: no edema, erythema, or tenderness Neurologic: Grossly intact Psychiatric: mood appropriate, affect full  US OB Comp Less 14 Wks  Result Date: 11/03/2019 Patient Name: Shannette Tabares DOB: 21-Sep-1980 MRN: [id number] ULTRASOUND REPORT Location: [facility] Date of Service: 11/03/2019 Indications:dating and spotting in early ob Findings: No intra/extrauterine pregnancy is seen today. The endometrium is slightly heterogeneous and measures 13.7 mm. Right Ovary is normal in appearance. Left Ovary is normal appearance. Corpus luteal cyst:  is not  visualized Survey of the adnexa demonstrates no adnexal masses. There is no free peritoneal fluid in the cul de sac. Impression: 1. The endometrium lining is somewhat thick but no gestational sac is seen within the uterus. 2. Normal blood flow is seen within the uterus. 3. Normal appearing uterus, cervix and ovaries. 4. No free fluid. Recommendations: 1.Clinical correlation with the patient's History and Physical Exam. Gweneth Dimitri, RT No gestation is demonstrated on today's study.  Malachy Mood, MD, [facility], [facility] 11/03/2019, 2:22 PM   Repeat bedside ultrasound today once again showing thickened endometrium, normal ovaries bilaterally without adnexal masses or tenderness.  There are two very slight potential lucencies noted in the fundal endometrium but no well developed gestational sacs are seen.    Assessment: 39 y.o. G2P2001 Unknown presenting for evaluation of first trimester vaginal bleeding  Plan: Problem List Items Addressed This Visit    None    Visit Diagnoses    First trimester bleeding    -  Primary   Relevant Orders   Beta hCG quant (ref lab)   Beta hCG quant (ref lab)   Progesterone   Pregnancy with inconclusive fetal viability, single or unspecified fetus       Relevant Orders   Beta hCG quant (ref lab)   Beta hCG quant (ref lab)   Progesterone   Pregnancy of unknown anatomic location       Relevant Orders   Beta hCG quant (ref lab)   Beta hCG quant (ref lab)   Progesterone      1) First trimester bleeding - incidence and clinical course of first trimester bleeding is discussed in detail with the patient today.  Approximately 1/3 of pregnancies ending in live births experienced 1st trimester bleeding.  The amount of bleeding is variable and not necessarily predictive of outcome.  Sources may be cervical or uterine.  Subchorionic hemorrhages are a frequent concurrent findings on ultrasound and are followed expectantly.  These often  absorb or regress spontaneously although risk for expansion and further disruption of the utero-placental interface leading to miscarriage is possible.  There is no clearly documented benefit to limiting or modifying activity and sexual intercourse in altering clinic course of 1st trimester bleeding.    2) Given absence of a demonstrable IUP (and no previous documentation of IUP) will trend HCG levels.  At this point I would continue her estrogen/progesterone support until we see the HCG trend in the next 48-hrs.  Should that be rising in appropriately, falling, or show a plateau would withdraw support.  This should result in passage of pregnancy although we talked about cytotec as well  3) The patient is Rh positive, rhogam is therefore not indicated to decrease the risk rhesus alloimmunization.    4) Routine bleeding precautions were discussed with the patient prior the conclusion of today's visit.    Malachy Mood, MD, [facility], [facility] 11/04/2019, 8:31 AM

## 2019-11-04 ENCOUNTER — Encounter: Payer: Self-pay | Admitting: Obstetrics and Gynecology

## 2019-11-04 ENCOUNTER — Ambulatory Visit (INDEPENDENT_AMBULATORY_CARE_PROVIDER_SITE_OTHER): Payer: 59 | Admitting: Obstetrics and Gynecology

## 2019-11-04 VITALS — BP 119/61 | HR 74 | Wt 144.0 lb

## 2019-11-04 DIAGNOSIS — O209 Hemorrhage in early pregnancy, unspecified: Secondary | ICD-10-CM

## 2019-11-04 DIAGNOSIS — O3680X0 Pregnancy with inconclusive fetal viability, not applicable or unspecified: Secondary | ICD-10-CM

## 2019-11-04 NOTE — Progress Notes (Signed)
NOB Light bleeding, no cramping, no N&V

## 2019-11-05 DIAGNOSIS — O3680X0 Pregnancy with inconclusive fetal viability, not applicable or unspecified: Secondary | ICD-10-CM | POA: Diagnosis not present

## 2019-11-05 DIAGNOSIS — O209 Hemorrhage in early pregnancy, unspecified: Secondary | ICD-10-CM | POA: Diagnosis not present

## 2019-11-06 ENCOUNTER — Other Ambulatory Visit: Payer: Self-pay | Admitting: Obstetrics and Gynecology

## 2019-11-06 LAB — BETA HCG QUANT (REF LAB): hCG Quant: 2 m[IU]/mL

## 2019-11-06 LAB — PROGESTERONE: Progesterone: 24.9 ng/mL

## 2019-11-06 MED ORDER — MISOPROSTOL 200 MCG PO TABS: 600.0000 ug | ORAL_TABLET | Freq: Once | ORAL | 0 refills | Status: DC | PRN

## 2019-11-06 NOTE — Progress Notes (Signed)
HCG level 74mIU/mL on 11/05/2019 progesterone level 24.9ng/mL HCG 444mIU/mL5/13/2021 progesterone level 34.23ng/mL HCG 100.2 on 10/12/2019 progesterone level 27.d2ng/mL  Given essentially negative HCG, as well as ultrasound findings this week will plan to withdraw estrogen/progestin support.  I anticipate this will result in a menstrual cycle within the next week.  I do not anticipate an issues as there does not appear to be any fetal tissue to pass but we discussed Cytotec and methergine as options.  Will call in rx for Cytotec.

## 2020-01-06 DIAGNOSIS — E289 Ovarian dysfunction, unspecified: Secondary | ICD-10-CM | POA: Diagnosis not present

## 2020-03-04 ENCOUNTER — Other Ambulatory Visit: Payer: Self-pay

## 2020-03-04 ENCOUNTER — Ambulatory Visit (INDEPENDENT_AMBULATORY_CARE_PROVIDER_SITE_OTHER): Payer: 59

## 2020-03-04 DIAGNOSIS — Z23 Encounter for immunization: Secondary | ICD-10-CM

## 2020-03-07 NOTE — Progress Notes (Signed)
   Covid-19 Vaccination Clinic  Name:  Maysen Bonsignore    MRN: [id number] DOB: [birth year]  03/07/2020  Ms. Eastlick was observed post Covid-19 immunization for 15 minutes without incident. She was provided with Vaccine Information Sheet and instruction to access the V-Safe system.   Ms. Pinela was instructed to call 911 with any severe reactions post vaccine: Marland Kitchen Difficulty breathing  . Swelling of face and throat  . A fast heartbeat  . A bad rash all over body  . Dizziness and weakness   Immunizations Administered    Name Date Dose VIS Date Route   Pfizer COVID-19 Vaccine 03/04/2020 12:58 PM 0.3 mL 07/29/2018 Intramuscular   Manufacturer: [employer], [employer]   Lot: [id number]   NDC: [id number]

## 2020-03-07 NOTE — Addendum Note (Signed)
Addended by: Johny Drilling on: 03/07/2020 07:11 PM   Modules accepted: Level of Service

## 2020-06-16 ENCOUNTER — Other Ambulatory Visit: Payer: Self-pay | Admitting: Obstetrics and Gynecology

## 2020-06-16 MED ORDER — NORGESTIMATE-ETH ESTRADIOL 0.25-35 MG-MCG PO TABS: 1.0000 | ORAL_TABLET | Freq: Every day | ORAL | 11 refills | Status: DC

## 2020-08-22 ENCOUNTER — Encounter: Payer: Self-pay | Admitting: Obstetrics and Gynecology

## 2020-08-22 ENCOUNTER — Ambulatory Visit (INDEPENDENT_AMBULATORY_CARE_PROVIDER_SITE_OTHER): Payer: 59 | Admitting: Obstetrics and Gynecology

## 2020-08-22 ENCOUNTER — Other Ambulatory Visit: Payer: Self-pay | Admitting: Obstetrics and Gynecology

## 2020-08-22 ENCOUNTER — Other Ambulatory Visit: Payer: Self-pay

## 2020-08-22 VITALS — BP 110/62 | Ht 63.0 in | Wt 128.0 lb

## 2020-08-22 DIAGNOSIS — R5383 Other fatigue: Secondary | ICD-10-CM | POA: Diagnosis not present

## 2020-08-22 DIAGNOSIS — R002 Palpitations: Secondary | ICD-10-CM | POA: Diagnosis not present

## 2020-08-22 DIAGNOSIS — Z01419 Encounter for gynecological examination (general) (routine) without abnormal findings: Secondary | ICD-10-CM | POA: Diagnosis not present

## 2020-08-22 DIAGNOSIS — Z1329 Encounter for screening for other suspected endocrine disorder: Secondary | ICD-10-CM | POA: Diagnosis not present

## 2020-08-22 DIAGNOSIS — Z3041 Encounter for surveillance of contraceptive pills: Secondary | ICD-10-CM | POA: Diagnosis not present

## 2020-08-22 DIAGNOSIS — Z13220 Encounter for screening for lipoid disorders: Secondary | ICD-10-CM | POA: Diagnosis not present

## 2020-08-22 DIAGNOSIS — Z1239 Encounter for other screening for malignant neoplasm of breast: Secondary | ICD-10-CM

## 2020-08-22 MED ORDER — NORGESTIMATE-ETH ESTRADIOL 0.25-35 MG-MCG PO TABS: 1.0000 | ORAL_TABLET | Freq: Every day | ORAL | 3 refills | Status: DC

## 2020-08-22 NOTE — Progress Notes (Signed)
Gynecology Annual Exam   PCP: Johny Drilling, DO  Chief Complaint:  Chief Complaint  Patient presents with  . Gynecologic Exam    Annual - Refill OCP. RM 5    History of Present Illness: Patient is a 40 y.o. [id number] presents for annual exam. The patient has no complaints today. She has lost 145 lbs in the past year, has been doing Keto.  Is interested in checking her cholesterol.  In addition she reports palpitations and is requesting cardiology referral.   LMP: Patient's last menstrual period was 08/13/2020. Average Interval: regular, 28 days Duration of flow: 5 days Heavy Menses: no Clots: no Intermenstrual Bleeding: no Postcoital Bleeding: no Dysmenorrhea: no Much better cycle control since switching to Ortho-Cyclen.  The patient is sexually active. She currently uses OCP (estrogen/progesterone) for contraception. She denies dyspareunia.  The patient does perform self breast exams.  There is no notable family history of breast or ovarian cancer in her family.  The patient wears seatbelts: yes.   The patient has regular exercise: not asked.    The patient denies current symptoms of depression.    Review of Systems: Review of Systems  Constitutional: Negative for chills and fever.  HENT: Negative for congestion.   Respiratory: Negative for cough and shortness of breath.   Cardiovascular: Positive for palpitations. Negative for chest pain.  Gastrointestinal: Negative for abdominal pain, constipation, diarrhea, heartburn, nausea and vomiting.  Genitourinary: Negative for dysuria, frequency and urgency.  Skin: Negative for itching and rash.  Neurological: Negative for dizziness and headaches.  Endo/Heme/Allergies: Negative for polydipsia.  Psychiatric/Behavioral: Negative for depression.    Past Medical History:  Patient Active Problem List   Diagnosis Date Noted  . Kidney stones 12/26/2015    Past Surgical History:  No past surgical history on  file.  Gynecologic History:  Patient's last menstrual period was 08/13/2020. Contraception: OCP (estrogen/progesterone) Last Pap: Results were:03/03/2018 NIL and HR HPV negative   Obstetric History: [id number]  Family History:  Family History  Problem Relation Age of Onset  . Bladder Cancer Neg Hx   . Kidney cancer Neg Hx     Social History:  Social History   Socioeconomic History  . Marital status: Married    Spouse name: Not on file  . Number of children: Not on file  . Years of education: Not on file  . Highest education level: Not on file  Occupational History  . Not on file  Tobacco Use  . Smoking status: Never Smoker  . Smokeless tobacco: Never Used  Vaping Use  . Vaping Use: Never used  Substance and Sexual Activity  . Alcohol use: No  . Drug use: No  . Sexual activity: Yes    Birth control/protection: Pill  Other Topics Concern  . Not on file  Social History Narrative  . Not on file   Social Determinants of Health   Financial Resource Strain: Not on file  Food Insecurity: Not on file  Transportation Needs: Not on file  Physical Activity: Not on file  Stress: Not on file  Social Connections: Not on file  Intimate Partner Violence: Not on file    Allergies:  No Known Allergies  Medications: Prior to Admission medications   Medication Sig Start Date End Date Taking? Authorizing Provider  norgestimate-ethinyl estradiol (ORTHO-CYCLEN) 0.25-35 MG-MCG tablet Take 1 tablet by mouth daily. 06/16/20  Yes Vena Austria, MD  estradiol (ESTRACE) 2 MG tablet  10/07/19   [provider]  misoprostol (  CYTOTEC) 200 MCG tablet Take 3 tablets (600 mcg total) by mouth once as needed for up to 1 dose. If experiencing heavier bleeding Patient not taking: Reported on 08/22/2020 11/06/19   Vena Austria, MD  progesterone 50 MG/ML injection Inject 50 mg into the muscle daily. Patient not taking: Reported on 08/22/2020 10/20/19   [provider]     Physical Exam Vitals: Blood pressure 110/62, height 5\' 3"  (1.6 m), weight 128 lb (58.1 kg), last menstrual period 08/13/2020, currently breastfeeding.  General: NAD HEENT: normocephalic, anicteric Thyroid: no enlargement, no palpable nodules Pulmonary: No increased work of breathing, CTAB Cardiovascular: RRR, distal pulses 2+ Breast: Breast symmetrical, no tenderness, no palpable nodules or masses, no skin or nipple retraction present, no nipple discharge.  No axillary or supraclavicular lymphadenopathy. Abdomen: NABS, soft, non-tender, non-distended.  Umbilicus without lesions.  No hepatomegaly, splenomegaly or masses palpable. No evidence of hernia  Genitourinary:  External: Normal external female genitalia.  Normal urethral meatus, normal Bartholin's and Skene's glands.    Vagina: Normal vaginal mucosa, no evidence of prolapse.    Cervix: Grossly normal in appearance, no bleeding  Uterus: Non-enlarged, mobile, normal contour.  No CMT  Adnexa: ovaries non-enlarged, no adnexal masses  Rectal: deferred  Lymphatic: no evidence of inguinal lymphadenopathy Extremities: no edema, erythema, or tenderness Neurologic: Grossly intact Psychiatric: mood appropriate, affect full  Female chaperone present for pelvic and breast  portions of the physical exam    Assessment: 40 y.o. 30 routine annual exam  Plan: Problem List Items Addressed This Visit   None   Visit Diagnoses    Encounter for gynecological examination without abnormal finding    -  Primary   Breast screening       Thyroid disorder screening       Relevant Orders   TSH   Lipid screening       Heart palpitations       Relevant Orders   Ambulatory referral to Cardiology   Lipid panel   Basic Metabolic Panel (BMET)   TSH   CBC   Fatigue, unspecified type       Relevant Orders   Lipid panel   Basic Metabolic Panel (BMET)   TSH   CBC   Surveillance for birth control, oral contraceptives          2) STI  screening  was notoffered and therefore not obtained  2)  ASCCP guidelines and rational discussed.  Patient opts for every 5 years screening interval  3) Contraception - the patient is currently using  OCP (estrogen/progesterone).  She is happy with her current form of contraception and plans to continue  4) Routine healthcare maintenance including cholesterol, diabetes screening discussed Ordered today to return fasting  5) Palpitiations - cardiology referral.  Will also check TSH with labs today   Return in about 1 year (around 08/22/2021) for annual.   08/24/2021, MD, Vena Austria OB/[facility]N, [facility] 08/22/2020, 10:10 AM

## 2020-09-03 ENCOUNTER — Other Ambulatory Visit: Payer: Self-pay

## 2020-09-03 MED FILL — Norgestimate & Ethinyl Estradiol Tab 0.25 MG-35 MCG: ORAL | 84 days supply | Qty: 84 | Fill #0 | Status: AC

## 2020-09-03 MED FILL — Norgestimate & Ethinyl Estradiol Tab 0.25 MG-35 MCG: ORAL | 84 days supply | Qty: 84 | Fill #0 | Status: CN

## 2020-09-28 ENCOUNTER — Other Ambulatory Visit: Payer: Self-pay

## 2020-09-28 ENCOUNTER — Ambulatory Visit (INDEPENDENT_AMBULATORY_CARE_PROVIDER_SITE_OTHER): Payer: 59 | Admitting: Cardiovascular Disease

## 2020-09-28 ENCOUNTER — Encounter: Payer: Self-pay | Admitting: Cardiovascular Disease

## 2020-09-28 VITALS — BP 110/80 | HR 77 | Ht 63.0 in | Wt 128.5 lb

## 2020-09-28 DIAGNOSIS — R0789 Other chest pain: Secondary | ICD-10-CM | POA: Diagnosis not present

## 2020-09-28 DIAGNOSIS — R002 Palpitations: Secondary | ICD-10-CM

## 2020-09-28 DIAGNOSIS — Z13220 Encounter for screening for lipoid disorders: Secondary | ICD-10-CM | POA: Diagnosis not present

## 2020-09-28 NOTE — Progress Notes (Signed)
Cardiology Office Note  Date:  09/28/2020   ID:  Bambi, Fehnel [birth year], MRN [id number]  PCP:  Vena Austria, MD   Chief Complaint  Patient presents with  . New Patient (Initial Visit)    Ref by Dr. Bonney Aid for palpitations. Patient c/o mid-sternum chest pain and palpitations; symptoms for 6 months with a strong family history of A-Fib with her mom having a stoke due to a-Fib. Medications reviewed by the patient verbally.     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[place]  Reports over the past couple of months has appreciated palpitations usually when sitting, at rest Could be secondary to stress Not with activity Symptoms typically last less 1 min, 1-2 x per month  Also reports periodic chest pain, center chest At rest not with exertion, feels it is likely musculoskeletal  EKG personally reviewed by myself on todays visit  Shows normal sinus rhythm with rate 77 bpm no significant ST or T wave changes  Discussed family history Mother with CVA, afib, age 22 Grandmother With cardiac history    PMH:   has a past medical history of Laceration, obstetrical, second degree.  PSH:   History reviewed. No pertinent surgical history.  Current Outpatient Medications  Medication Sig Dispense Refill  . norgestimate-ethinyl estradiol (ORTHO-CYCLEN) 0.25-35 MG-MCG tablet TAKE 1 TABLET BY MOUTH DAILY. 84 tablet 3   No current facility-administered medications for this visit.     Allergies:   Patient has no known allergies.   Social History:  The patient  reports that she has never smoked. She has never used smokeless tobacco. She reports that she does not drink alcohol and does not use drugs.   Family History:   family history includes Arrhythmia in her mother; Heart disease in her father; Hyperlipidemia in her father; Hypertension in her  father; Stroke in her mother.    Review of Systems: Review of Systems  Constitutional: Negative.   HENT: Negative.   Respiratory: Negative.   Cardiovascular: Negative.   Gastrointestinal: Negative.   Musculoskeletal: Negative.   Neurological: Negative.   Psychiatric/Behavioral: Negative.   All other systems reviewed and are negative.   PHYSICAL EXAM: VS:  BP 110/80 (BP Location: Right Arm, Patient Position: Sitting, Cuff Size: Normal)   Pulse 77   Ht 5\' 3"  (1.6 m)   Wt 128 lb 8 oz (58.3 kg)   LMP 08/13/2020   SpO2 99%   BMI 22.76 kg/m  , BMI Body mass index is 22.76 kg/m. GEN: Well nourished, well developed, in no acute distress HEENT: normal Neck: no JVD, carotid bruits, or masses Cardiac: RRR; no murmurs, rubs, or gallops,no edema  Respiratory:  clear to auscultation bilaterally, normal work of breathing GI: soft, nontender, nondistended, + BS MS: no deformity or atrophy Skin: warm and dry, no rash Neuro:  Strength and sensation are intact Psych: euthymic mood, full affect  Recent Labs: No results found for requested labs within last 8760 hours.    Lipid Panel No results found for: CHOL, HDL, LDLCALC, TRIG    Wt Readings from Last 3 Encounters:  09/28/20 128 lb 8 oz (58.3 kg)  08/22/20 128 lb (58.1 kg)  11/04/19 144 lb (65.3 kg)      ASSESSMENT AND PLAN:  Problem List Items Addressed This Visit   None   Visit Diagnoses    Palpitations    -  Primary   Relevant Orders   EKG 12-Lead   CBC   Comprehensive metabolic panel   TSH   LONG TERM MONITOR (3-14 DAYS)   Screening for hyperlipidemia       Relevant Orders   Lipid panel   Atypical chest pain       Relevant Orders   EKG 12-Lead   LONG TERM MONITOR (3-14 DAYS)     Palpitations Likely APCs or PVCs Discussed in detail, ZIO monitor ordered Could use beta-blockers on as needed or regular basis for worsening symptoms  Chest pain Atypical in nature, low risk, at rest, Discussed screening  studies such as CT coronary calcium scoring if symptoms get worse     Total encounter time more than 45 minutes  Greater than 50% was spent in counseling and coordination of care with the patient    Signed, Dossie Arbour, M.D., Ph.D. [facility] [place], [place] [phone]

## 2020-09-28 NOTE — Patient Instructions (Addendum)
Medication Instructions:  No changes  If you need a refill on your cardiac medications before your next appointment, please call your pharmacy.    Lab work: CBC, CMP, TSH, lipids  LABS WILL APPEAR ON MYCHART, ABNORMAL RESULTS WILL BE CALLED  Testing/Procedures: Zio monitor: (it will arrive in the mail) For palpitations, r/o atrial fib  Heart monitor (Zio patch) for 2 weeks (14 days)   Your physician has recommended that you wear a Zio monitor. This monitor is a medical device that records the heart's electrical activity. Doctors most often use these monitors to diagnose arrhythmias. Arrhythmias are problems with the speed or rhythm of the heartbeat. The monitor is a small device applied to your chest. You can wear one while you do your normal daily activities. While wearing this monitor if you have any symptoms to push the button and record what you felt. Once you have worn this monitor for the period of time provider prescribed (Usually 14 days), you will return the monitor device in the postage paid box. Once it is returned they will download the data collected and provide [place] with a report which the provider will then review and we will call you with those results. Important tips:  1. Avoid showering during the first 24 hours of wearing the monitor. 2. Avoid excessive sweating to help maximize wear time. 3. Do not submerge the device, no hot tubs, and no swimming pools. 4. Keep any lotions or oils away from the patch. 5. After 24 hours you may shower with the patch on. Take brief showers with your back facing the shower head.  6. Do not remove patch once it has been placed because that will interrupt data and decrease adhesive wear time. 7. Push the button when you have any symptoms and write down what you were feeling. 8. Once you have completed wearing your monitor, remove and place into box which has postage paid and place in your outgoing mailbox.  9. If for some reason you have  misplaced your box then call our office and we can provide another box and/or mail it off for you.       Follow-Up: At [facility], you and your health needs are our priority.  As part of our continuing mission to provide you with exceptional heart care, we have created designated Provider Care Teams.  These Care Teams include your primary Cardiologist (physician) and Advanced Practice Providers (APPs -  Physician Assistants and Nurse Practitioners) who all work together to provide you with the care you need, when you need it.  . You will need a follow up appointment as needed  Please call the clinic when you would like to be seen or when you have any cardiac concerns   . Providers on your designated Care Team:   . Nicolasa Ducking, NP . Eula Listen, PA-C . Marisue Ivan, PA-C  COVID-19 Vaccine Information can be found at: [email or web address] For questions related to vaccine distribution or appointments, please email [email or web address] or call [phone].

## 2020-09-28 NOTE — Progress Notes (Signed)
Pt had visit with Dr. Mariah Milling today. Monitor ordered. Monitor given to pt--pt stated she is going on vacation and would like to wait to place monitor when she comes back. Serial number for ZIO [id number]. Pt stated she will send a MyChart message the day she puts on the monitor so monitor can be registered. Ok per Miles, Charity fundraiser.  Note and serial number sticker placed in ZIO XT binder.

## 2020-09-29 LAB — CBC
Hematocrit: 39.7 % (ref 34.0–46.6)
Hemoglobin: 13.3 g/dL (ref 11.1–15.9)
MCH: 28.7 pg (ref 26.6–33.0)
MCHC: 33.5 g/dL (ref 31.5–35.7)
MCV: 86 fL (ref 79–97)
Platelets: 373 10*3/uL (ref 150–450)
RBC: 4.63 x10E6/uL (ref 3.77–5.28)
RDW: 11.8 % (ref 11.7–15.4)
WBC: 9.4 10*3/uL (ref 3.4–10.8)

## 2020-09-29 LAB — COMPREHENSIVE METABOLIC PANEL
ALT: 9 IU/L (ref 0–32)
AST: 16 IU/L (ref 0–40)
Albumin/Globulin Ratio: 1.5 (ref 1.2–2.2)
Albumin: 4.4 g/dL (ref 3.8–4.8)
Alkaline Phosphatase: 80 IU/L (ref 44–121)
BUN/Creatinine Ratio: 14 (ref 9–23)
BUN: 9 mg/dL (ref 6–20)
Bilirubin Total: 0.2 mg/dL (ref 0.0–1.2)
CO2: 20 mmol/L (ref 20–29)
Calcium: 10 mg/dL (ref 8.7–10.2)
Chloride: 104 mmol/L (ref 96–106)
Creatinine, Ser: 0.64 mg/dL (ref 0.57–1.00)
Globulin, Total: 2.9 g/dL (ref 1.5–4.5)
Glucose: 82 mg/dL (ref 65–99)
Potassium: 4.7 mmol/L (ref 3.5–5.2)
Sodium: 139 mmol/L (ref 134–144)
Total Protein: 7.3 g/dL (ref 6.0–8.5)
eGFR: 117 mL/min/{1.73_m2} (ref >59)

## 2020-09-29 LAB — LIPID PANEL
Chol/HDL Ratio: 3.1 ratio (ref 0.0–4.4)
Cholesterol, Total: 171 mg/dL (ref 100–199)
HDL: 56 mg/dL (ref >39)
LDL Chol Calc (NIH): 90 mg/dL (ref 0–99)
Triglycerides: 144 mg/dL (ref 0–149)
VLDL Cholesterol Cal: 25 mg/dL (ref 5–40)

## 2020-09-29 LAB — TSH: TSH: 0.965 u[IU]/mL (ref 0.450–4.500)

## 2020-10-05 NOTE — Progress Notes (Signed)
Pt's lab results mailed to pt, pt has not utilized her MyChart account to see results.

## 2020-11-10 ENCOUNTER — Telehealth: Payer: Self-pay

## 2020-11-10 NOTE — Telephone Encounter (Signed)
MyChart message sent to pt regarding her ZIO monitor given to her on 4/27  Per encounter note visit from 09/28/2020   Pt had visit with Dr. Mariah Milling today. Monitor ordered. Monitor given to pt--pt stated she is going on vacation and would like to wait to place monitor when she comes back. Serial number for ZIO [id number]. Pt stated she will send a MyChart message the day she puts on the monitor so monitor can be registered. Ok per Roy, Charity fundraiser.   Note and serial number sticker placed in ZIO XT binder.  Advised pt to reach back out to [place] with questions regarding monitor, when she plans to apply the monitor, or if she plans to return the monitor.  Will await pt's response.

## 2020-12-15 ENCOUNTER — Other Ambulatory Visit: Payer: Self-pay

## 2020-12-15 MED FILL — Norgestimate & Ethinyl Estradiol Tab 0.25 MG-35 MCG: ORAL | 84 days supply | Qty: 84 | Fill #1 | Status: AC

## 2020-12-23 ENCOUNTER — Ambulatory Visit: Payer: 59 | Admitting: Obstetrics and Gynecology

## 2021-03-10 ENCOUNTER — Other Ambulatory Visit: Payer: Self-pay

## 2021-03-10 MED FILL — Norgestimate & Ethinyl Estradiol Tab 0.25 MG-35 MCG: ORAL | 84 days supply | Qty: 84 | Fill #2 | Status: AC

## 2021-06-08 ENCOUNTER — Telehealth: Payer: Self-pay | Admitting: Pediatrics

## 2021-06-08 ENCOUNTER — Other Ambulatory Visit: Payer: Self-pay

## 2021-06-08 MED ORDER — OSELTAMIVIR PHOSPHATE 75 MG PO CAPS
75.0000 mg | ORAL_CAPSULE | Freq: Two times a day (BID) | ORAL | 0 refills | Status: DC
  Filled 2021-06-08: qty 10, 5d supply, fill #0

## 2021-06-08 MED FILL — Norgestimate & Ethinyl Estradiol Tab 0.25 MG-35 MCG: ORAL | 84 days supply | Qty: 84 | Fill #3 | Status: AC

## 2021-06-08 NOTE — Telephone Encounter (Signed)
Please send Tamiflu to the pharmacy for Flu exposure. Thank you.

## 2021-06-09 ENCOUNTER — Emergency Department (HOSPITAL_COMMUNITY): Admission: EM | Admit: 2021-06-09 | Discharge: 2021-06-09 | Discharge status: Against Medical Advice | Payer: 59

## 2021-06-09 ENCOUNTER — Ambulatory Visit: Payer: 59 | Admitting: Obstetrics and Gynecology

## 2021-06-09 DIAGNOSIS — R319 Hematuria, unspecified: Secondary | ICD-10-CM | POA: Diagnosis not present

## 2021-06-09 NOTE — ED Triage Notes (Signed)
Informed registration that she thinks she passed a kidney stone, decided to leave

## 2021-06-12 ENCOUNTER — Other Ambulatory Visit: Payer: Self-pay

## 2021-06-12 MED ORDER — NITROFURANTOIN MONOHYD MACRO 100 MG PO CAPS
100.0000 mg | ORAL_CAPSULE | Freq: Two times a day (BID) | ORAL | 0 refills | Status: AC
  Filled 2021-06-12: qty 14, 7d supply, fill #0

## 2022-03-13 ENCOUNTER — Telehealth: Payer: Self-pay | Admitting: Cardiovascular Disease

## 2022-03-13 DIAGNOSIS — Z8249 Family history of ischemic heart disease and other diseases of the circulatory system: Secondary | ICD-10-CM

## 2022-03-13 DIAGNOSIS — R002 Palpitations: Secondary | ICD-10-CM

## 2022-03-13 NOTE — Telephone Encounter (Signed)
Spoke with patient and she had monitor for palpitations. She said those have somewhat resolved but her brother wore a monitor and he was positive for both afib and SVT. Reordered monitor for delivery to her home and instructions provided. She was appreciative with no further questions.   Verbal discussion with Dr. Rockey Situ regarding request for another monitor. Given symptoms monitor was ordered.

## 2022-03-13 NOTE — Telephone Encounter (Signed)
Pt is requesting call back to see about having another order for a zio monitor. She states she was not able to complete the one from 09/2020 and would like to discuss what she needs to do to start this again. Please advise.

## 2022-03-16 ENCOUNTER — Ambulatory Visit (INDEPENDENT_AMBULATORY_CARE_PROVIDER_SITE_OTHER): Payer: 59 | Admitting: Licensed Practical Nurse

## 2022-03-16 ENCOUNTER — Other Ambulatory Visit (HOSPITAL_COMMUNITY)
Admission: RE | Admit: 2022-03-16 | Discharge: 2022-03-16 | Disposition: A | Discharge status: Home / Self Care | Payer: 59 | Source: Ambulatory Visit | Attending: Licensed Practical Nurse | Admitting: Licensed Practical Nurse

## 2022-03-16 ENCOUNTER — Other Ambulatory Visit: Payer: Self-pay

## 2022-03-16 VITALS — BP 120/72 | HR 74 | Ht 63.0 in | Wt 139.0 lb

## 2022-03-16 DIAGNOSIS — Z124 Encounter for screening for malignant neoplasm of cervix: Secondary | ICD-10-CM | POA: Diagnosis not present

## 2022-03-16 DIAGNOSIS — N930 Postcoital and contact bleeding: Secondary | ICD-10-CM

## 2022-03-16 DIAGNOSIS — Z01419 Encounter for gynecological examination (general) (routine) without abnormal findings: Secondary | ICD-10-CM

## 2022-03-16 MED ORDER — NORGESTIMATE-ETH ESTRADIOL 0.25-35 MG-MCG PO TABS
1.0000 | ORAL_TABLET | Freq: Every day | ORAL | 3 refills | Status: AC
  Filled 2022-03-16: qty 84, 84d supply, fill #0

## 2022-03-16 NOTE — Progress Notes (Signed)
Last Pap 03/03/2018

## 2022-03-16 NOTE — Progress Notes (Unsigned)
Gynecology Annual Exam   PCP: Malachy Mood, MD  Chief Complaint: No chief complaint on file.   History of Present Illness: Patient is a 41 y.o. G3P2002 presents for annual exam. The patient has no complaints today.   LMP: Patient's last menstrual period was 03/03/2022 (exact date). Average Interval: {Desc; regular/irreg:14544}, {numbers 22-35:14824} days Duration of flow: {numbers; 0-10:33138} days Heavy Menses: {yes/no:63} Clots: {yes/no:63} Intermenstrual Bleeding: {yes/no:63} Postcoital Bleeding: {yes/no:63} Dysmenorrhea: {yes/no:63}  The patient {sys sexually active:13135} sexually active. She currently uses {method:5051} for contraception. She {has/denies:315300} dyspareunia.  The patient {DOES_DOES UVO:53664} perform self breast exams.  There {is/is no:19420} notable family history of breast or ovarian cancer in her family.  The patient wears seatbelts: {yes/no:63}.   The patient has regular exercise: {yes/no/not asked:9010}.    The patient {Blank single:19197::"reports","denies"} current symptoms of depression.    Review of Systems: ROS  Past Medical History:  Patient Active Problem List   Diagnosis Date Noted   Kidney stones 12/26/2015    Past Surgical History:  No past surgical history on file.  Gynecologic History:  Patient's last menstrual period was 03/03/2022 (exact date). Contraception: {method:5051} Last Pap: Results were: {Findings; lab pap smear results:16707::"NIL and HR HPV+","NIL and HR HPV negative"}   Obstetric History: [id number]  Family History:  Family History  Problem Relation Age of Onset   Stroke Mother    Arrhythmia Mother        A-fib    Hypertension Father    Hyperlipidemia Father    Heart disease Father        stent placement    Bladder Cancer Neg Hx    Kidney cancer Neg Hx     Social History:  Social History   Socioeconomic History   Marital status: Married    Spouse name: Not on file   Number of children: Not on  file   Years of education: Not on file   Highest education level: Not on file  Occupational History   Not on file  Tobacco Use   Smoking status: Never   Smokeless tobacco: Never  Vaping Use   Vaping Use: Never used  Substance and Sexual Activity   Alcohol use: No   Drug use: No   Sexual activity: Yes    Birth control/protection: Pill  Other Topics Concern   Not on file  Social History Narrative   Not on file   Social Determinants of Health   Financial Resource Strain: Not on file  Food Insecurity: Not on file  Transportation Needs: Not on file  Physical Activity: Not on file  Stress: Not on file  Social Connections: Not on file  Intimate Partner Violence: Not on file    Allergies:  No Known Allergies  Medications: Prior to Admission medications   Medication Sig Start Date End Date Taking? Authorizing Provider  nitrofurantoin, macrocrystal-monohydrate, (MACROBID) 100 MG capsule Take 1 capsule (100 mg total) by mouth 2 (two) times daily. Patient not taking: Reported on 03/16/2022 06/12/21     norgestimate-ethinyl estradiol (ORTHO-CYCLEN) 0.25-35 MG-MCG tablet TAKE 1 TABLET BY MOUTH DAILY. 08/22/20 08/31/21  Malachy Mood, MD  oseltamivir (TAMIFLU) 75 MG capsule Take 1 capsule (75 mg total) by mouth 2 (two) times daily. 06/08/21   Iven Finn, DO    Physical Exam Vitals: Blood pressure 120/72, pulse 74, height 5\' 3"  (1.6 m), weight 139 lb (63 kg), last menstrual period 03/03/2022, unknown if currently breastfeeding.  General: NAD HEENT: normocephalic, anicteric Thyroid: no enlargement, no palpable  nodules Pulmonary: No increased work of breathing, CTAB Cardiovascular: RRR, distal pulses 2+ Breast: Breast symmetrical, no tenderness, no palpable nodules or masses, no skin or nipple retraction present, no nipple discharge.  No axillary or supraclavicular lymphadenopathy. Abdomen: NABS, soft, non-tender, non-distended.  Umbilicus without lesions.  No hepatomegaly,  splenomegaly or masses palpable. No evidence of hernia  Genitourinary:  External: Normal external female genitalia.  Normal urethral meatus, normal Bartholin's and Skene's glands.    Vagina: Normal vaginal mucosa, no evidence of prolapse.    Cervix: Grossly normal in appearance, no bleeding  Uterus: Non-enlarged, mobile, normal contour.  No CMT  Adnexa: ovaries non-enlarged, no adnexal masses  Rectal: deferred  Lymphatic: no evidence of inguinal lymphadenopathy Extremities: no edema, erythema, or tenderness Neurologic: Grossly intact Psychiatric: mood appropriate, affect full  Female chaperone present for pelvic and breast  portions of the physical exam    Assessment: 41 y.o. G3P2002 routine annual exam  Plan: Problem List Items Addressed This Visit   None   2) STI screening  {Blank single:19197::"was","was not"}offered and {Blank single:19197::"accepted","declined","therefore not obtained"}  2)  ASCCP guidelines and rational discussed.  Patient opts for {Blank single:19197::"***","every 5 years","every 3 years","yearly","discontinue age >65","discontinue secondary to prior hysterectomy"} screening interval  3) Contraception - the patient is currently using  {method:5051}.  She is {Blank single:19197::"happy with her current form of contraception and plans to continue","interested in changing to ***","interested in starting Contraception: ***","not currently in need of contraception secondary to being sterile","attempting to conceive in the near future"}  4) Routine healthcare maintenance including cholesterol, diabetes screening discussed {Blank single:19197::"managed by PCP","Ordered today","To return fasting at a later date","Declines"}  5) No follow-ups on file.  Carie Caddy, CNM  [facility], [facility] 03/16/2022, 10:12 AM

## 2022-03-19 LAB — CERVICOVAGINAL ANCILLARY ONLY
Bacterial Vaginitis (gardnerella): NEGATIVE
Candida Glabrata: NEGATIVE
Candida Vaginitis: NEGATIVE
Comment: NEGATIVE
Comment: NEGATIVE
Comment: NEGATIVE

## 2022-03-20 DIAGNOSIS — Z0100 Encounter for examination of eyes and vision without abnormal findings: Secondary | ICD-10-CM | POA: Diagnosis not present

## 2022-03-20 LAB — CYTOLOGY - PAP
Chlamydia: NEGATIVE
Comment: NEGATIVE
Comment: NEGATIVE
Comment: NEGATIVE
Comment: NORMAL
Diagnosis: NEGATIVE
Diagnosis: REACTIVE
High risk HPV: NEGATIVE
Neisseria Gonorrhea: NEGATIVE
Trichomonas: NEGATIVE

## 2022-03-23 DIAGNOSIS — Z5321 Procedure and treatment not carried out due to patient leaving prior to being seen by health care provider: Secondary | ICD-10-CM | POA: Diagnosis not present

## 2022-04-09 ENCOUNTER — Other Ambulatory Visit: Payer: Self-pay

## 2022-07-04 ENCOUNTER — Other Ambulatory Visit: Payer: Self-pay

## 2022-07-04 ENCOUNTER — Other Ambulatory Visit: Payer: Self-pay | Admitting: Family Medicine

## 2022-07-04 DIAGNOSIS — Z8249 Family history of ischemic heart disease and other diseases of the circulatory system: Secondary | ICD-10-CM

## 2022-07-04 DIAGNOSIS — Z23 Encounter for immunization: Secondary | ICD-10-CM | POA: Diagnosis not present

## 2022-07-04 DIAGNOSIS — Z83719 Family history of colon polyps, unspecified: Secondary | ICD-10-CM | POA: Diagnosis not present

## 2022-07-04 DIAGNOSIS — Z0000 Encounter for general adult medical examination without abnormal findings: Secondary | ICD-10-CM | POA: Diagnosis not present

## 2022-07-04 DIAGNOSIS — R5383 Other fatigue: Secondary | ICD-10-CM | POA: Diagnosis not present

## 2022-07-06 DIAGNOSIS — Z13220 Encounter for screening for lipoid disorders: Secondary | ICD-10-CM | POA: Diagnosis not present

## 2022-07-06 DIAGNOSIS — Z8249 Family history of ischemic heart disease and other diseases of the circulatory system: Secondary | ICD-10-CM | POA: Diagnosis not present

## 2022-07-06 DIAGNOSIS — R5383 Other fatigue: Secondary | ICD-10-CM | POA: Diagnosis not present

## 2022-08-22 ENCOUNTER — Encounter: Payer: Self-pay | Admitting: *Deleted

## 2023-03-21 ENCOUNTER — Ambulatory Visit: Payer: 59 | Admitting: Orthopaedic Surgery

## 2023-03-25 DIAGNOSIS — S92355D Nondisplaced fracture of fifth metatarsal bone, left foot, subsequent encounter for fracture with routine healing: Secondary | ICD-10-CM | POA: Diagnosis not present

## 2023-09-05 ENCOUNTER — Other Ambulatory Visit: Payer: Self-pay | Admitting: Family Medicine

## 2023-09-05 DIAGNOSIS — Z1231 Encounter for screening mammogram for malignant neoplasm of breast: Secondary | ICD-10-CM

## 2023-10-04 ENCOUNTER — Ambulatory Visit
Admission: RE | Admit: 2023-10-04 | Discharge: 2023-10-04 | Disposition: A | Discharge status: Home / Self Care | Source: Ambulatory Visit | Attending: Family Medicine

## 2023-10-04 DIAGNOSIS — Z1231 Encounter for screening mammogram for malignant neoplasm of breast: Secondary | ICD-10-CM | POA: Diagnosis not present

## 2023-10-08 ENCOUNTER — Encounter: Payer: Self-pay | Admitting: Family Medicine

## 2023-10-09 ENCOUNTER — Other Ambulatory Visit: Payer: Self-pay | Admitting: Family Medicine

## 2023-10-09 DIAGNOSIS — R928 Other abnormal and inconclusive findings on diagnostic imaging of breast: Secondary | ICD-10-CM

## 2023-10-18 DIAGNOSIS — Z006 Encounter for examination for normal comparison and control in clinical research program: Secondary | ICD-10-CM | POA: Diagnosis not present

## 2023-10-18 DIAGNOSIS — R1312 Dysphagia, oropharyngeal phase: Secondary | ICD-10-CM | POA: Diagnosis not present

## 2023-10-18 DIAGNOSIS — S92351A Displaced fracture of fifth metatarsal bone, right foot, initial encounter for closed fracture: Secondary | ICD-10-CM | POA: Diagnosis not present

## 2023-11-22 ENCOUNTER — Other Ambulatory Visit: Payer: Self-pay

## 2023-11-22 DIAGNOSIS — Z23 Encounter for immunization: Secondary | ICD-10-CM | POA: Diagnosis not present

## 2023-11-22 DIAGNOSIS — R5383 Other fatigue: Secondary | ICD-10-CM | POA: Diagnosis not present

## 2023-11-22 DIAGNOSIS — Z8249 Family history of ischemic heart disease and other diseases of the circulatory system: Secondary | ICD-10-CM | POA: Diagnosis not present

## 2023-11-22 DIAGNOSIS — Z0000 Encounter for general adult medical examination without abnormal findings: Secondary | ICD-10-CM | POA: Diagnosis not present

## 2023-11-22 DIAGNOSIS — N760 Acute vaginitis: Secondary | ICD-10-CM | POA: Diagnosis not present

## 2023-11-22 MED ORDER — FLUCONAZOLE 150 MG PO TABS
150.0000 mg | ORAL_TABLET | Freq: Once | ORAL | 0 refills | Status: AC
  Filled 2023-11-22: qty 2, 3d supply, fill #0

## 2024-01-10 ENCOUNTER — Other Ambulatory Visit: Payer: Self-pay

## 2024-01-10 MED ORDER — SODIUM FLUORIDE 1.1 % DT PSTE
1.0000 | PASTE | Freq: Every day | DENTAL | 5 refills | Status: AC
  Filled 2024-01-10 – 2024-02-14 (×3): qty 100, 30d supply, fill #0

## 2024-01-10 MED ORDER — CHLORHEXIDINE GLUCONATE 0.12 % MT SOLN
15.0000 mL | Freq: Two times a day (BID) | OROMUCOSAL | 1 refills | Status: AC
  Filled 2024-01-10 – 2024-02-14 (×2): qty 473, 16d supply, fill #0

## 2024-01-20 ENCOUNTER — Other Ambulatory Visit: Payer: Self-pay

## 2024-02-11 ENCOUNTER — Ambulatory Visit

## 2024-02-14 ENCOUNTER — Other Ambulatory Visit

## 2024-02-14 ENCOUNTER — Encounter

## 2024-02-14 ENCOUNTER — Other Ambulatory Visit: Payer: Self-pay

## 2024-02-21 ENCOUNTER — Other Ambulatory Visit

## 2024-02-21 ENCOUNTER — Inpatient Hospital Stay: Admission: RE | Admit: 2024-02-21 | Source: Ambulatory Visit

## 2024-03-06 ENCOUNTER — Ambulatory Visit
Admission: RE | Admit: 2024-03-06 | Discharge: 2024-03-06 | Disposition: A | Discharge status: Home / Self Care | Source: Ambulatory Visit | Attending: Family Medicine | Admitting: Family Medicine

## 2024-03-06 ENCOUNTER — Other Ambulatory Visit: Payer: Self-pay | Admitting: Family Medicine

## 2024-03-06 DIAGNOSIS — N6001 Solitary cyst of right breast: Secondary | ICD-10-CM | POA: Diagnosis not present

## 2024-03-06 DIAGNOSIS — R92333 Mammographic heterogeneous density, bilateral breasts: Secondary | ICD-10-CM | POA: Diagnosis not present

## 2024-03-06 DIAGNOSIS — R928 Other abnormal and inconclusive findings on diagnostic imaging of breast: Secondary | ICD-10-CM

## 2024-03-07 ENCOUNTER — Other Ambulatory Visit: Payer: Self-pay

## 2024-03-07 MED ORDER — NORGESTIM-ETH ESTRAD TRIPHASIC 0.18/0.215/0.25 MG-35 MCG PO TABS
1.0000 | ORAL_TABLET | Freq: Every day | ORAL | 0 refills | Status: DC
  Filled 2024-03-07: qty 84, 84d supply, fill #0

## 2024-03-07 MED ORDER — AZITHROMYCIN 250 MG PO TABS
ORAL_TABLET | ORAL | 0 refills | Status: AC
  Filled 2024-03-07: qty 6, 5d supply, fill #0

## 2024-03-07 MED ORDER — METRONIDAZOLE 500 MG PO TABS
500.0000 mg | ORAL_TABLET | Freq: Two times a day (BID) | ORAL | 0 refills | Status: AC
  Filled 2024-03-07: qty 14, 7d supply, fill #0

## 2024-03-07 MED ORDER — DOXYCYCLINE HYCLATE 100 MG PO CAPS
100.0000 mg | ORAL_CAPSULE | Freq: Every day | ORAL | 0 refills | Status: AC
  Filled 2024-03-07: qty 30, 30d supply, fill #0

## 2024-05-07 ENCOUNTER — Other Ambulatory Visit: Payer: Self-pay

## 2024-05-08 ENCOUNTER — Other Ambulatory Visit: Payer: Self-pay

## 2024-05-11 ENCOUNTER — Other Ambulatory Visit: Payer: Self-pay

## 2024-05-12 ENCOUNTER — Other Ambulatory Visit: Payer: Self-pay

## 2024-05-13 ENCOUNTER — Other Ambulatory Visit: Payer: Self-pay

## 2024-05-14 ENCOUNTER — Other Ambulatory Visit: Payer: Self-pay

## 2024-05-15 ENCOUNTER — Other Ambulatory Visit: Payer: Self-pay

## 2024-05-16 ENCOUNTER — Other Ambulatory Visit: Payer: Self-pay

## 2024-05-18 ENCOUNTER — Other Ambulatory Visit: Payer: Self-pay

## 2024-05-20 ENCOUNTER — Other Ambulatory Visit: Payer: Self-pay

## 2024-05-21 ENCOUNTER — Other Ambulatory Visit: Payer: Self-pay

## 2024-05-22 ENCOUNTER — Other Ambulatory Visit: Payer: Self-pay

## 2024-05-26 ENCOUNTER — Other Ambulatory Visit (HOSPITAL_COMMUNITY): Payer: Self-pay

## 2024-06-01 ENCOUNTER — Other Ambulatory Visit: Payer: Self-pay

## 2024-06-01 MED ORDER — NORGESTIM-ETH ESTRAD TRIPHASIC 0.18/0.215/0.25 MG-35 MCG PO TABS
1.0000 | ORAL_TABLET | Freq: Every day | ORAL | 3 refills | Status: AC
  Filled 2024-06-01: qty 84, 84d supply, fill #0
